# Patient Record
Sex: Female | Born: 1985
Health system: Southern US, Community
[De-identification: ages and names within clinical notes are randomized; demographics above are authoritative.]

## PROBLEM LIST (undated history)

## (undated) DIAGNOSIS — R51 Headache: Secondary | ICD-10-CM

## (undated) DIAGNOSIS — Z8489 Family history of other specified conditions: Secondary | ICD-10-CM

## (undated) DIAGNOSIS — N189 Chronic kidney disease, unspecified: Secondary | ICD-10-CM

## (undated) DIAGNOSIS — Z8619 Personal history of other infectious and parasitic diseases: Secondary | ICD-10-CM

## (undated) DIAGNOSIS — T7840XA Allergy, unspecified, initial encounter: Secondary | ICD-10-CM

## (undated) DIAGNOSIS — R519 Headache, unspecified: Secondary | ICD-10-CM

## (undated) HISTORY — PX: EYE SURGERY: SHX253

## (undated) HISTORY — DX: Allergy, unspecified, initial encounter: T78.40XA

## (undated) HISTORY — DX: Personal history of other infectious and parasitic diseases: Z86.19

---
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
## 2004-08-11 ENCOUNTER — Other Ambulatory Visit: Admission: RE | Admit: 2004-08-11 | Discharge: 2004-08-11 | Payer: Self-pay | Admitting: Obstetrics & Gynecology

## 2005-09-09 ENCOUNTER — Other Ambulatory Visit: Admission: RE | Admit: 2005-09-09 | Discharge: 2005-09-09 | Payer: Self-pay | Admitting: Obstetrics and Gynecology

## 2011-08-09 ENCOUNTER — Ambulatory Visit (INDEPENDENT_AMBULATORY_CARE_PROVIDER_SITE_OTHER): Payer: BC Managed Care – PPO

## 2011-08-09 DIAGNOSIS — J40 Bronchitis, not specified as acute or chronic: Secondary | ICD-10-CM

## 2011-10-20 ENCOUNTER — Ambulatory Visit (INDEPENDENT_AMBULATORY_CARE_PROVIDER_SITE_OTHER): Payer: BC Managed Care – PPO | Admitting: Physician Assistant

## 2011-10-20 VITALS — BP 118/76 | HR 72 | Temp 98.6°F | Resp 16 | Ht 66.25 in | Wt 206.0 lb

## 2011-10-20 DIAGNOSIS — R05 Cough: Secondary | ICD-10-CM

## 2011-10-20 DIAGNOSIS — J029 Acute pharyngitis, unspecified: Secondary | ICD-10-CM

## 2011-10-20 DIAGNOSIS — J069 Acute upper respiratory infection, unspecified: Secondary | ICD-10-CM

## 2011-10-20 MED ORDER — AZITHROMYCIN 250 MG PO TABS: ORAL_TABLET | ORAL | Status: AC

## 2011-10-20 MED ORDER — BENZONATATE 100 MG PO CAPS: 100.0000 mg | ORAL_CAPSULE | Freq: Three times a day (TID) | ORAL | Status: AC | PRN

## 2011-10-20 MED ORDER — IPRATROPIUM BROMIDE 0.06 % NA SOLN: 2.0000 | Freq: Three times a day (TID) | NASAL | Status: DC

## 2011-10-20 NOTE — Progress Notes (Signed)
Patient ID: Taylor Hicks MRN: [id number], DOB: 31-Aug-1985, 26 y.o. Date of Encounter: 10/20/2011, 9:14 AM  Primary Physician: No primary provider on file.  Chief Complaint:  Chief Complaint  Patient presents with  . Sore Throat    last night pnd sinus pain pressure  . Cough    mostly dry    HPI: 26 y.o. year old female presents with 5 day history of worsening nasal congestion, post nasal drip, and cough. Afebrile through out. No chills. Nasal congestion clear. Post nasal drip thick and yellow/green. Cough nonproductive and secondary to post nasal drip. Cough worse in the morning when she first wakes. Slightly muffled hearing. No sinus pressure. No headache. No GI complaints. Has tried Zyrtec x 1. Husband with similar symptoms. Treated with a Z pack earlier in the week, feeling much better.  Going on a cruise in 4 days.  No leg trauma, sedentary periods, h/o cancer, or tobacco use.  No past medical history on file.   Home Meds: Prior to Admission medications   Medication Sig Start Date End Date Taking? Authorizing Provider  etonogestrel-ethinyl estradiol (NUVARING) 0.12-0.015 MG/24HR vaginal ring Place 1 each vaginally every 28 (twenty-eight) days. Insert vaginally and leave in place for 3 consecutive weeks, then remove for 1 week.   Yes Historical Provider, MD    Allergies: No Known Allergies  History   Social History  . Marital Status: Married    Spouse Name: N/A    Number of Children: N/A  . Years of Education: N/A   Occupational History  . Not on file.   Social History Main Topics  . Smoking status: Never Smoker   . Smokeless tobacco: Not on file  . Alcohol Use: Not on file  . Drug Use: Not on file  . Sexually Active: Not on file   Other Topics Concern  . Not on file   Social History Narrative  . No narrative on file     Review of Systems: Constitutional: negative for chills, fever, night sweats or weight changes Cardiovascular: negative for chest pain  or palpitations Respiratory: negative for hemoptysis, wheezing, or shortness of breath Abdominal: negative for abdominal pain, nausea, vomiting or diarrhea Dermatological: negative for rash Neurologic: negative for headache   Physical Exam: Blood pressure 118/76, pulse 72, temperature 98.6 F (37 C), temperature source Oral, resp. rate 16, height 5' 6.25" (1.683 m), weight 206 lb (93.441 kg)., Body mass index is 33.00 kg/(m^2). General: Well developed, well nourished, in no acute distress. Head: Normocephalic, atraumatic, eyes without discharge, sclera non-icteric, nares are congested. Bilateral auditory canals clear, TM's are without perforation, pearly grey with reflective cone of light bilaterally. Serous effusion bilaterally behind TM's.No sinus TTP. Oral cavity moist, dentition normal. Posterior pharynx with post nasal drip and mild erythema. No peritonsillar abscess or tonsillar exudate. Neck: Supple. No thyromegaly. Full ROM. No lymphadenopathy. Lungs: Clear bilaterally to auscultation without wheezes, rales, or rhonchi. Breathing is unlabored. Heart: RRR with S1 S2. No murmurs, rubs, or gallops appreciated. Msk:  Strength and tone normal for age. Extremities: No clubbing or cyanosis. No edema. Neuro: Alert and oriented X 3. Moves all extremities spontaneously. CNII-XII grossly in tact. Psych:  Responds to questions appropriately with a normal affect.   Labs: Results for orders placed in visit on 10/20/11  POCT RAPID STREP A (OFFICE)      Component Value Range   Rapid Strep A Screen Negative  Negative    TC pending  ASSESSMENT AND PLAN:  26 y.o.  year old female with URI/cough -Azithromycin 250 MG #6 2 po first day then 1 po next 4 days no RF -Tessalon Perles 100 mg 1 po tid prn cough #30 no RF  -Atrovent NS 0.06% 2 sprays each nare bid prn #1 no RF -Mucinex -Tylenol/Motrin prn -Rest/fluids -RTC precautions -RTC 3-5 days if no improvement  Signed, Eula Listen,  PA-C 10/20/2011 9:14 AM

## 2011-10-23 LAB — CULTURE, GROUP A STREP

## 2011-11-14 ENCOUNTER — Ambulatory Visit (INDEPENDENT_AMBULATORY_CARE_PROVIDER_SITE_OTHER): Payer: BC Managed Care – PPO | Admitting: Family Medicine

## 2011-11-14 VITALS — BP 122/80 | HR 70 | Temp 98.8°F | Resp 18 | Ht 66.25 in | Wt 198.0 lb

## 2011-11-14 DIAGNOSIS — J029 Acute pharyngitis, unspecified: Secondary | ICD-10-CM

## 2011-11-14 NOTE — Progress Notes (Signed)
  Patient Name: Taylor Hicks Date of Birth: 1985-09-07 Medical Record Number: [id number] Gender: female Date of Encounter: 11/14/2011  History of Present Illness:  Taylor Hicks is a 26 y.o. very pleasant female patient who presents with the following:  Awoke today with a ST- husband seen today with allergies.  She notes that her throat felt worse today and that she felt fatigued.  She has a history of frequent strep.  No fever, no aches.  Notes a little PND, no cough or GI symtpoms.    There is no problem list on file for this patient.  No past medical history on file. No past surgical history on file. History  Substance Use Topics  . Smoking status: Never Smoker   . Smokeless tobacco: Not on file  . Alcohol Use: Not on file   No family history on file. No Known Allergies  Medication list has been reviewed and updated.  Review of Systems: As per HPI- otherwise negative.   Physical Examination: Filed Vitals:   11/14/11 1657  BP: 122/80  Pulse: 70  Temp: 98.8 F (37.1 C)  TempSrc: Oral  Resp: 18  Height: 5' 6.25" (1.683 m)  Weight: 198 lb (89.812 kg)    Body mass index is 31.72 kg/(m^2).  GEN: WDWN, NAD, Non-toxic, A & O x 3 HEENT: Atraumatic, Normocephalic. Neck supple. No masses, No LAD.  TM, oropharynx wnl,  PEERL Ears and Nose: No external deformity. CV: RRR, No M/G/R. No JVD. No thrill. No extra heart sounds. PULM: CTA B, no wheezes, crackles, rhonchi. No retractions. No resp. distress. No accessory muscle use. EXTR: No c/c/e NEURO Normal gait.  PSYCH: Normally interactive. Conversant. Not depressed or anxious appearing.  Calm demeanor.   Results for orders placed in visit on 11/14/11  POCT RAPID STREP A (OFFICE)      Component Value Range   Rapid Strep A Screen Negative  Negative    Assessment and Plan: 1. Sore throat  POCT rapid strep A, Throat culture ([facility])   Suspect viral vs allergic ST.  Follow- up pending culture, she has atrovent nasal to use  prn.  Sooner if worse.

## 2011-11-17 LAB — CULTURE, GROUP A STREP: Organism ID, Bacteria: NORMAL

## 2012-06-07 ENCOUNTER — Ambulatory Visit (INDEPENDENT_AMBULATORY_CARE_PROVIDER_SITE_OTHER): Payer: BC Managed Care – PPO | Admitting: Family Medicine

## 2012-06-07 ENCOUNTER — Encounter: Payer: Self-pay | Admitting: Family Medicine

## 2012-06-07 VITALS — BP 118/81 | HR 89 | Temp 98.2°F | Resp 16 | Ht 66.0 in | Wt 202.8 lb

## 2012-06-07 DIAGNOSIS — Z9283 Personal history of failed moderate sedation: Secondary | ICD-10-CM

## 2012-06-07 DIAGNOSIS — H6690 Otitis media, unspecified, unspecified ear: Secondary | ICD-10-CM

## 2012-06-07 DIAGNOSIS — Z283 Underimmunization status: Secondary | ICD-10-CM

## 2012-06-07 DIAGNOSIS — Z23 Encounter for immunization: Secondary | ICD-10-CM

## 2012-06-07 DIAGNOSIS — H9209 Otalgia, unspecified ear: Secondary | ICD-10-CM

## 2012-06-07 DIAGNOSIS — Z2839 Other underimmunization status: Secondary | ICD-10-CM

## 2012-06-07 DIAGNOSIS — H6691 Otitis media, unspecified, right ear: Secondary | ICD-10-CM

## 2012-06-07 DIAGNOSIS — J329 Chronic sinusitis, unspecified: Secondary | ICD-10-CM

## 2012-06-07 MED ORDER — AZITHROMYCIN 250 MG PO TABS: ORAL_TABLET | ORAL | Status: DC

## 2012-06-07 NOTE — Progress Notes (Signed)
[facility]:  Office Visit  Chief Complaint:  Chief Complaint  Patient presents with  . Ear Fullness    ears feel congested some sinus pain and pressure    HPI: Taylor Hicks is a 26 y.o. female who complains of  Ear congestion and URI sxs x several weeks. Tried sudafed without relief. Worse at night. Right worse than left. Subjective fever. Tuesday night had sinus HA between eyes. Took ibuprofen without relief.   History reviewed. No pertinent past medical history. History reviewed. No pertinent past surgical history. History   Social History  . Marital Status: Married    Spouse Name: N/A    Number of Children: N/A  . Years of Education: N/A   Social History Main Topics  . Smoking status: Never Smoker   . Smokeless tobacco: None  . Alcohol Use: Yes  . Drug Use: No  . Sexually Active: None   Other Topics Concern  . None   Social History Narrative  . None   Family History  Problem Relation Age of Onset  . Diabetes Father    No Known Allergies Prior to Admission medications   Medication Sig Start Date End Date Taking? Authorizing Provider  etonogestrel-ethinyl estradiol (NUVARING) 0.12-0.015 MG/24HR vaginal ring Place 1 each vaginally every 28 (twenty-eight) days. Insert vaginally and leave in place for 3 consecutive weeks, then remove for 1 week.   Yes Historical Provider, MD  pseudoephedrine (SUDAFED) 30 MG tablet Take 30 mg by mouth every 4 (four) hours as needed.   Yes Historical Provider, MD  pseudoephedrine-acetaminophen (TYLENOL SINUS) 30-500 MG TABS Take 1 tablet by mouth every 4 (four) hours as needed.   Yes Historical Provider, MD  ipratropium (ATROVENT) 0.06 % nasal spray Place 2 sprays into the nose 3 (three) times daily. 10/20/11 10/19/12  Raymon Mutton Dunn, PA-C     ROS: The patient denies fevers, chills, night sweats, unintentional weight loss, chest pain, palpitations, wheezing, dyspnea on exertion, nausea, vomiting, abdominal pain, dysuria,  hematuria, melena, numbness, weakness, or tingling.  All other systems have been reviewed and were otherwise negative with the exception of those mentioned in the HPI and as above.    PHYSICAL EXAM: Filed Vitals:   06/07/12 0800  BP: 118/81  Pulse: 89  Temp: 98.2 F (36.8 C)  Resp: 16   Filed Vitals:   06/07/12 0800  Height: 5\' 6"  (1.676 m)  Weight: 202 lb 12.8 oz (91.989 kg)   Body mass index is 32.73 kg/(m^2).  General: Alert, no acute distress HEENT:  Normocephalic, atraumatic, oropharynx patent. Right Tm erythematous, No fluid + sinus tenderness.No exudates Cardiovascular:  Regular rate and rhythm, no rubs murmurs or gallops.  No Carotid bruits, radial pulse intact. No pedal edema.  Respiratory: Clear to auscultation bilaterally.  No wheezes, rales, or rhonchi.  No cyanosis, no use of accessory musculature GI: No organomegaly, abdomen is soft and non-tender, positive bowel sounds.  No masses. Skin: No rashes. Neurologic: Facial musculature symmetric. Psychiatric: Patient is appropriate throughout our interaction. Lymphatic: No cervical lymphadenopathy Musculoskeletal: Gait intact.   LABS:   EKG/XRAY:   Primary read interpreted by Dr. Conley Rolls at [facility].   ASSESSMENT/PLAN: Encounter Diagnoses  Name Primary?  . Otalgia Yes  . Sinusitis   . Right otitis media   . Immunization deficiency    Flu vaccine given Rx Azithromycin for sinusitis. Her R otitis media is in early stages.Will give short course abx instead amoxacillin which will be double the time  frame.      LE, THAO PHUONG, DO 06/07/2012 8:30 AM

## 2012-06-13 ENCOUNTER — Ambulatory Visit (INDEPENDENT_AMBULATORY_CARE_PROVIDER_SITE_OTHER): Payer: BC Managed Care – PPO | Admitting: Family Medicine

## 2012-06-13 VITALS — BP 110/68 | HR 62 | Temp 98.1°F | Resp 16 | Ht 66.0 in | Wt 203.6 lb

## 2012-06-13 DIAGNOSIS — R21 Rash and other nonspecific skin eruption: Secondary | ICD-10-CM

## 2012-06-13 DIAGNOSIS — H6990 Unspecified Eustachian tube disorder, unspecified ear: Secondary | ICD-10-CM

## 2012-06-13 DIAGNOSIS — H6980 Other specified disorders of Eustachian tube, unspecified ear: Secondary | ICD-10-CM

## 2012-06-13 MED ORDER — PREDNISONE 20 MG PO TABS: ORAL_TABLET | ORAL | Status: DC

## 2012-06-13 NOTE — Patient Instructions (Addendum)
You may want to take claritin during the day, and you may want to add zantac for a few days as well.  Use benadryl as needed for itching, but remember it can make you drowsy

## 2012-06-13 NOTE — Progress Notes (Signed)
[facility] [address], [place] [address] [phone]  Date:  06/13/2012   Name:  Taylor Hicks   DOB:  Dec 26, 1985   MRN:  [id number]  PCP:  Taylor Finner, MD    Chief Complaint: Follow-up   History of Present Illness:  Taylor Hicks is a 26 y.o. very pleasant female patient who presents with the following:  She was here in 06/07/12 with complaint of ear congestion and URI symptoms.  She was found to have right OM and was treated with azithromycin.   She did feel that her ear got better, but it was hurting again this am. It has been popping and feeling full.  She has a little bit of pressure in her left ear, but her right ear is the main problem.  She is also concerned about a rash.  She had noted some itching on her skin on Sunday- she was here and received the zpack on Thursday.  She developed a rash on her abdomen, and hives that got worse on Monday.  The rash is now much better, and she has minimal itching.  No oral swelling, no SOB, no wheezing.   She has not taken any benadryl today so far- she last took it at 0300 this morning.    No chance of pregnancy- she is due for her next period in the next few days and is on contraception  There is no problem list on file for this patient.   No past medical history on file.  No past surgical history on file.  History  Substance Use Topics  . Smoking status: Never Smoker   . Smokeless tobacco: Not on file  . Alcohol Use: Yes    Family History  Problem Relation Age of Onset  . Diabetes Father     No Known Allergies  Medication list has been reviewed and updated.  Current Outpatient Prescriptions on File Prior to Visit  Medication Sig Dispense Refill  . etonogestrel-ethinyl estradiol (NUVARING) 0.12-0.015 MG/24HR vaginal ring Place 1 each vaginally every 28 (twenty-eight) days. Insert vaginally and leave in place for 3 consecutive weeks, then remove for 1 week.      . pseudoephedrine (SUDAFED)  30 MG tablet Take 30 mg by mouth every 4 (four) hours as needed.      Marland Kitchen azithromycin (ZITHROMAX) 250 MG tablet Take 2 pills po now then 1 pill po daily x 4 more days  6 tablet  0  . ipratropium (ATROVENT) 0.06 % nasal spray Place 2 sprays into the nose 3 (three) times daily.  15 mL  0  . pseudoephedrine-acetaminophen (TYLENOL SINUS) 30-500 MG TABS Take 1 tablet by mouth every 4 (four) hours as needed.        Review of Systems:  As per HPI- otherwise negative.   Physical Examination: Filed Vitals:   06/13/12 1044  BP: 110/68  Pulse: 62  Temp: 98.1 F (36.7 C)  Resp: 16   Filed Vitals:   06/13/12 1044  Height: 5\' 6"  (1.676 m)  Weight: 203 lb 9.6 oz (92.352 kg)   Body mass index is 32.86 kg/(m^2). Ideal Body Weight: Weight in (lb) to have BMI = 25: 154.6   GEN: WDWN, NAD, Non-toxic, A & O x 3, overweight HEENT: Atraumatic, Normocephalic. Neck supple. No masses, No LAD.  Left TM wnl, right TM appears normal but there is some fluid behind it- no redness, oropharynx normal.  PEERL,EOMI.   Ears and Nose: No external  deformity. CV: RRR, No M/G/R. No JVD. No thrill. No extra heart sounds. PULM: CTA B, no wheezes, crackles, rhonchi. No retractions. No resp. distress. No accessory muscle use. ABD: S, NT, ND Skin: there are some excoriations over her abdomen, but minimal rash remains.  No true urticaria at this time.  No other rash, no facial swelling or edema EXTR: No c/c/e NEURO Normal gait.  PSYCH: Normally interactive. Conversant. Not depressed or anxious appearing.  Calm demeanor.    Assessment and Plan: 1. Rash  predniSONE (DELTASONE) 20 MG tablet  2. ETD (eustachian tube dysfunction)     Suspect that Aerith may have had an allergic reaction to the azithromycin.  Will have her avoid this medication in the future.  No sign of current OM- likely that her current symptoms are due to ETD.  Will use a short course of prednisone for both.  See pt instructions for more details.  Let me  know if not better in the next couple of days- Sooner if worse.     Abbe Amsterdam, MD

## 2012-08-21 ENCOUNTER — Ambulatory Visit (INDEPENDENT_AMBULATORY_CARE_PROVIDER_SITE_OTHER): Payer: BC Managed Care – PPO | Admitting: Family Medicine

## 2012-08-21 VITALS — BP 119/88 | HR 89 | Temp 98.7°F | Resp 18 | Wt 201.0 lb

## 2012-08-21 DIAGNOSIS — H6590 Unspecified nonsuppurative otitis media, unspecified ear: Secondary | ICD-10-CM

## 2012-08-21 DIAGNOSIS — J029 Acute pharyngitis, unspecified: Secondary | ICD-10-CM

## 2012-08-21 LAB — POCT RAPID STREP A (OFFICE): Rapid Strep A Screen: NEGATIVE

## 2012-08-21 MED ORDER — FLUTICASONE PROPIONATE 50 MCG/ACT NA SUSP: 2.0000 | Freq: Every day | NASAL | Status: DC

## 2012-08-21 NOTE — Progress Notes (Signed)
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 She developed a sore throat on Sunday. It is continued to bother her. She's not had a fever. She coughs in the night when her throat gets very dry, but is not coughing in the day time. Her husband been sick with a illness for over a week. It is now gone. They do not have children.  She was treated for an ear problem several weeks ago, and it mostly cleared up but she still feels like she may have a little fluid her pressure in the right ear.  Objective: Healthy appearing lady in no major distress. TMs both are normal in coloration there may be a tiny bit of fluid at the base of the right ear still her throat is erythematous without exudate. Strep screen is taken. Neck supple without very small nodes. Chest is clear to auscultation. Heart regular without murmurs.  Assessment: Pharyngitis Very mild right serous otitis  Plan Check strep screen  Results for orders placed in visit on 08/21/12  POCT RAPID STREP A (OFFICE)      Component Value Range   Rapid Strep A Screen Negative  Negative   Will treat with flonase for ear, symptomatic throat rx

## 2012-08-21 NOTE — Patient Instructions (Addendum)
Viral Pharyngitis  Viral pharyngitis is a viral infection that produces redness, pain, and swelling (inflammation) of the throat. It can spread from person to person (contagious).  CAUSES  Viral pharyngitis is caused by inhaling a large amount of certain germs called viruses. Many different viruses cause viral pharyngitis.  SYMPTOMS  Symptoms of viral pharyngitis include:   Sore throat.   Tiredness.   Stuffy nose.   Low-grade fever.   Congestion.   Cough.  TREATMENT  Treatment includes rest, drinking plenty of fluids, and the use of over-the-counter medication (approved by your caregiver).  HOME CARE INSTRUCTIONS    Drink enough fluids to keep your urine clear or pale yellow.   Eat soft, cold foods such as ice cream, frozen ice pops, or gelatin dessert.   Gargle with warm salt water (1 tsp salt per 1 qt of water).   If over age 7, throat lozenges may be used safely.   Only take over-the-counter or prescription medicines for pain, discomfort, or fever as directed by your caregiver. Do not take aspirin.  To help prevent spreading viral pharyngitis to others, avoid:   Mouth-to-mouth contact with others.   Sharing utensils for eating and drinking.   Coughing around others.  SEEK MEDICAL CARE IF:    You are better in a few days, then become worse.   You have a fever or pain not helped by pain medicines.   There are any other changes that concern you.  Document Released: 05/18/2005 Document Revised: 10/31/2011 Document Reviewed: 10/14/2010  Exit[employer] Patient Information 2013 [employer].

## 2013-06-27 ENCOUNTER — Other Ambulatory Visit: Payer: Self-pay

## 2014-05-03 ENCOUNTER — Ambulatory Visit (INDEPENDENT_AMBULATORY_CARE_PROVIDER_SITE_OTHER): Payer: BC Managed Care – PPO | Admitting: Family Medicine

## 2014-05-03 VITALS — BP 105/70 | HR 91 | Temp 98.4°F | Ht 66.5 in | Wt 231.0 lb

## 2014-05-03 DIAGNOSIS — J029 Acute pharyngitis, unspecified: Secondary | ICD-10-CM

## 2014-05-03 DIAGNOSIS — H9209 Otalgia, unspecified ear: Secondary | ICD-10-CM

## 2014-05-03 DIAGNOSIS — H9202 Otalgia, left ear: Secondary | ICD-10-CM

## 2014-05-03 LAB — POCT RAPID STREP A (OFFICE): Rapid Strep A Screen: NEGATIVE

## 2014-05-03 MED ORDER — AMOXICILLIN 875 MG PO TABS: 875.0000 mg | ORAL_TABLET | Freq: Two times a day (BID) | ORAL | Status: DC

## 2014-05-03 NOTE — Progress Notes (Signed)
[facility] [address] [place] [address] [phone]  Date:  05/03/2014   Name:  Taylor Hicks   DOB:  [birth year]   MRN:  [id number]  PCP:  Freddy Finner, MD    Chief Complaint: Otalgia and Nasal Congestion   History of Present Illness:  Taylor Hicks is a 28 y.o. very pleasant female patient who presents with the following:  She has noted pain in her left ear for close to 2 weeks. It seems to be getting worse over the last week.  She has used saline NS and a neti- pot, but stil getting worse.   The left side of her face has hurt over the last couple of days.   She sees Dr. Jennette Kettle for her OB care. She tried sudafed on their advice over the last few days.   She felt like the sudafed was helping some, but then last night her ear hurt more again.  She has noted a bit of a ST.  She did have a cough last night.   She has noted some chills, but not fever She is due on 9/24.  Pregnancy is going well,  She did lose her mucus plug and noted a trace of blood after her cercix was checked yesterday.  Her baby is moving well.  No cramps or pain, no LOF.  This is her first baby   There are no active problems to display for this patient.   Past Medical History  Diagnosis Date  . Allergy     Past Surgical History  Procedure Laterality Date  . Eye surgery      History  Substance Use Topics  . Smoking status: Never Smoker   . Smokeless tobacco: Not on file  . Alcohol Use: Yes    Family History  Problem Relation Age of Onset  . Diabetes Father   . Hyperlipidemia Mother   . Diabetes Paternal Grandmother   . Diabetes Paternal Grandfather     Allergies  Allergen Reactions  . Azithromycin     May have caused a rash    Medication list has been reviewed and updated.  Current Outpatient Prescriptions on File Prior to Visit  Medication Sig Dispense Refill  . etonogestrel-ethinyl estradiol (NUVARING) 0.12-0.015 MG/24HR vaginal ring Place 1 each vaginally  every 28 (twenty-eight) days. Insert vaginally and leave in place for 3 consecutive weeks, then remove for 1 week.      . fluticasone (FLONASE) 50 MCG/ACT nasal spray Place 2 sprays into the nose daily.  1 g  3   No current facility-administered medications on file prior to visit.    Review of Systems:  As per HPI- otherwise negative.   Physical Examination: Filed Vitals:   05/03/14 0816  BP: 130/78  Pulse: 91   Filed Vitals:   05/03/14 0816  Height: 5' 6.5" (1.689 m)  Weight: 231 lb (104.781 kg)   Body mass index is 36.73 kg/(m^2). Ideal Body Weight: Weight in (lb) to have BMI = 25: 156.9  GEN: WDWN, NAD, Non-toxic, A & O x 3, looks well, pregnant HEENT: Atraumatic, Normocephalic. Neck supple. No masses, No LAD.  Bilateral TM wnl, oropharynx normal.  PEERL,EOMI.   Ears and Nose: No external deformity. CV: RRR, No M/G/R. No JVD. No thrill. No extra heart sounds. PULM: CTA B, no wheezes, crackles, rhonchi. No retractions. No resp. distress. No accessory muscle use. ABD: S, NT, ND, +BS. No rebound. No HSM. EXTR: No c/c/e NEURO  Normal gait.  PSYCH: Normally interactive. Conversant. Not depressed or anxious appearing.  Calm demeanor.   Results for orders placed in visit on 05/03/14  POCT RAPID STREP A (OFFICE)      Result Value Ref Range   Rapid Strep A Screen Negative  Negative   FHT at 120 BPM, abdomen is gravid but non- rigid, non- tender Assessment and Plan: Left ear pain - Plan: POCT rapid strep A, amoxicillin (AMOXIL) 875 MG tablet, DISCONTINUED: amoxicillin (AMOXIL) 875 MG tablet  Acute pharyngitis, unspecified pharyngitis type - Plan: POCT rapid strep A, amoxicillin (AMOXIL) 875 MG tablet, DISCONTINUED: amoxicillin (AMOXIL) 875 MG tablet  Treat for persistent ear pain and ST- may represent sinusitis- with amoxicillin as above.  Advised her to call her OBG office and report the minimal bleeding that she noted this morning- she will do so.  She will let us know if not  better soon- Sooner if worse.   Meds ordered this encounter  Medications  . cetirizine (ZYRTEC) 10 MG tablet    Sig: Take 10 mg by mouth daily.  . Prenatal Vit-Fe Fumarate-FA (PRENATAL MULTIVITAMIN) TABS tablet    Sig: Take 1 tablet by mouth daily at 12 noon.  Marland Kitchen DISCONTD: amoxicillin (AMOXIL) 875 MG tablet    Sig: Take 1 tablet (875 mg total) by mouth 2 (two) times daily.    Dispense:  20 tablet    Refill:  0  . amoxicillin (AMOXIL) 875 MG tablet    Sig: Take 1 tablet (875 mg total) by mouth 2 (two) times daily.    Dispense:  20 tablet    Refill:  0     Signed Abbe Amsterdam, MD

## 2014-05-03 NOTE — Patient Instructions (Signed)
Use the amoxicillin as directed for your ear and throat pain.  You can continue to use OTC medications as allowed per your OB-GYN.  Please call your OB practice and check in with them regarding the blood tinged mucus that you had today.  This is likely nothing to worry about but I could recommend that you call and make sure.

## 2014-05-04 ENCOUNTER — Encounter (HOSPITAL_COMMUNITY): Payer: Self-pay | Admitting: *Deleted

## 2014-05-04 ENCOUNTER — Inpatient Hospital Stay (HOSPITAL_COMMUNITY)
Admission: AD | Admit: 2014-05-04 | Discharge: 2014-05-08 | DRG: 766 | Disposition: A | Discharge status: Home / Self Care | Payer: BC Managed Care – PPO | Source: Ambulatory Visit | Attending: Obstetrics and Gynecology | Admitting: Obstetrics and Gynecology

## 2014-05-04 DIAGNOSIS — O4290 Premature rupture of membranes, unspecified as to length of time between rupture and onset of labor, unspecified weeks of gestation: Secondary | ICD-10-CM | POA: Diagnosis present

## 2014-05-04 DIAGNOSIS — Z833 Family history of diabetes mellitus: Secondary | ICD-10-CM

## 2014-05-04 DIAGNOSIS — O324XX0 Maternal care for high head at term, not applicable or unspecified: Secondary | ICD-10-CM | POA: Diagnosis present

## 2014-05-04 DIAGNOSIS — O26859 Spotting complicating pregnancy, unspecified trimester: Secondary | ICD-10-CM | POA: Diagnosis present

## 2014-05-04 LAB — CBC
HEMATOCRIT: 37.4 % (ref 36.0–46.0)
HEMOGLOBIN: 12.6 g/dL (ref 12.0–15.0)
MCH: 28.9 pg (ref 26.0–34.0)
MCHC: 33.7 g/dL (ref 30.0–36.0)
MCV: 85.8 fL (ref 78.0–100.0)
Platelets: 144 10*3/uL — ABNORMAL LOW (ref 150–400)
RBC: 4.36 MIL/uL (ref 3.87–5.11)
RDW: 12.5 % (ref 11.5–15.5)
WBC: 9.6 10*3/uL (ref 4.0–10.5)

## 2014-05-04 LAB — OB RESULTS CONSOLE RPR: RPR: NONREACTIVE

## 2014-05-04 LAB — TYPE AND SCREEN
ABO/RH(D): O POS
Antibody Screen: NEGATIVE

## 2014-05-04 LAB — URINALYSIS, ROUTINE W REFLEX MICROSCOPIC
BILIRUBIN URINE: NEGATIVE
GLUCOSE, UA: NEGATIVE mg/dL
HGB URINE DIPSTICK: NEGATIVE
KETONES UR: NEGATIVE mg/dL
Leukocytes, UA: NEGATIVE
NITRITE: NEGATIVE
PH: 6.5 (ref 5.0–8.0)
Protein, ur: NEGATIVE mg/dL
Urobilinogen, UA: 0.2 mg/dL (ref 0.0–1.0)

## 2014-05-04 LAB — OB RESULTS CONSOLE HIV ANTIBODY (ROUTINE TESTING): HIV: NONREACTIVE

## 2014-05-04 LAB — OB RESULTS CONSOLE GC/CHLAMYDIA
CHLAMYDIA, DNA PROBE: NEGATIVE
Gonorrhea: NEGATIVE

## 2014-05-04 LAB — OB RESULTS CONSOLE GBS: GBS: NEGATIVE

## 2014-05-04 LAB — OB RESULTS CONSOLE ABO/RH: RH Type: POSITIVE

## 2014-05-04 LAB — RPR

## 2014-05-04 LAB — OB RESULTS CONSOLE RUBELLA ANTIBODY, IGM: RUBELLA: NON-IMMUNE/NOT IMMUNE

## 2014-05-04 LAB — OB RESULTS CONSOLE HEPATITIS B SURFACE ANTIGEN: Hepatitis B Surface Ag: NEGATIVE

## 2014-05-04 LAB — OB RESULTS CONSOLE ANTIBODY SCREEN: Antibody Screen: NEGATIVE

## 2014-05-04 LAB — AMNISURE RUPTURE OF MEMBRANE (ROM) NOT AT ARMC: Amnisure ROM: POSITIVE

## 2014-05-04 MED ORDER — CITRIC ACID-SODIUM CITRATE 334-500 MG/5ML PO SOLN
30.0000 mL | ORAL | Status: DC | PRN
  Administered 2014-05-05 (×2): 30 mL via ORAL
  Filled 2014-05-04 (×2): qty 15

## 2014-05-04 MED ORDER — OXYCODONE-ACETAMINOPHEN 5-325 MG PO TABS: 2.0000 | ORAL_TABLET | ORAL | Status: DC | PRN

## 2014-05-04 MED ORDER — LACTATED RINGERS IV SOLN
INTRAVENOUS | Status: DC
  Administered 2014-05-04 – 2014-05-05 (×7): via INTRAVENOUS

## 2014-05-04 MED ORDER — OXYTOCIN BOLUS FROM INFUSION: 500.0000 mL | INTRAVENOUS | Status: DC

## 2014-05-04 MED ORDER — FENTANYL 2.5 MCG/ML BUPIVACAINE 1/10 % EPIDURAL INFUSION (WH - ANES)
14.0000 mL/h | INTRAMUSCULAR | Status: DC | PRN
  Administered 2014-05-05 (×4): 14 mL/h via EPIDURAL
  Filled 2014-05-04 (×4): qty 125

## 2014-05-04 MED ORDER — TERBUTALINE SULFATE 1 MG/ML IJ SOLN: 0.2500 mg | Freq: Once | INTRAMUSCULAR | Status: AC | PRN

## 2014-05-04 MED ORDER — ONDANSETRON HCL 4 MG/2ML IJ SOLN: 4.0000 mg | Freq: Four times a day (QID) | INTRAMUSCULAR | Status: DC | PRN

## 2014-05-04 MED ORDER — PSEUDOEPHEDRINE HCL ER 120 MG PO TB12
120.0000 mg | ORAL_TABLET | Freq: Two times a day (BID) | ORAL | Status: DC | PRN
  Administered 2014-05-04 – 2014-05-05 (×2): 120 mg via ORAL
  Filled 2014-05-04 (×2): qty 1

## 2014-05-04 MED ORDER — LACTATED RINGERS IV SOLN
500.0000 mL | Freq: Once | INTRAVENOUS | Status: AC
  Administered 2014-05-05: 500 mL via INTRAVENOUS

## 2014-05-04 MED ORDER — LIDOCAINE HCL (PF) 1 % IJ SOLN
30.0000 mL | INTRAMUSCULAR | Status: AC | PRN
  Administered 2014-05-05 (×2): 5 mL via SUBCUTANEOUS

## 2014-05-04 MED ORDER — PHENYLEPHRINE 40 MCG/ML (10ML) SYRINGE FOR IV PUSH (FOR BLOOD PRESSURE SUPPORT): 80.0000 ug | PREFILLED_SYRINGE | INTRAVENOUS | Status: DC | PRN

## 2014-05-04 MED ORDER — AMOXICILLIN 875 MG PO TABS
875.0000 mg | ORAL_TABLET | Freq: Two times a day (BID) | ORAL | Status: DC
  Administered 2014-05-04: 875 mg via ORAL
  Filled 2014-05-04 (×5): qty 1

## 2014-05-04 MED ORDER — AMOXICILLIN 250 MG PO CAPS: 875.0000 mg | ORAL_CAPSULE | Freq: Two times a day (BID) | ORAL | Status: DC

## 2014-05-04 MED ORDER — EPHEDRINE 5 MG/ML INJ: 10.0000 mg | INTRAVENOUS | Status: DC | PRN

## 2014-05-04 MED ORDER — DIPHENHYDRAMINE HCL 50 MG/ML IJ SOLN
12.5000 mg | INTRAMUSCULAR | Status: DC | PRN
  Administered 2014-05-05 (×2): 12.5 mg via INTRAVENOUS
  Filled 2014-05-04: qty 1

## 2014-05-04 MED ORDER — AMOXICILLIN 875 MG PO TABS
875.0000 mg | ORAL_TABLET | Freq: Two times a day (BID) | ORAL | Status: DC
  Administered 2014-05-05: 875 mg via ORAL

## 2014-05-04 MED ORDER — LACTATED RINGERS IV SOLN: 500.0000 mL | INTRAVENOUS | Status: DC | PRN

## 2014-05-04 MED ORDER — OXYTOCIN 40 UNITS IN LACTATED RINGERS INFUSION - SIMPLE MED
1.0000 m[IU]/min | INTRAVENOUS | Status: DC
  Administered 2014-05-04 – 2014-05-05 (×2): 2 m[IU]/min via INTRAVENOUS
  Filled 2014-05-04 (×2): qty 1000

## 2014-05-04 MED ORDER — OXYTOCIN 40 UNITS IN LACTATED RINGERS INFUSION - SIMPLE MED: 62.5000 mL/h | INTRAVENOUS | Status: DC

## 2014-05-04 MED ORDER — PHENYLEPHRINE 40 MCG/ML (10ML) SYRINGE FOR IV PUSH (FOR BLOOD PRESSURE SUPPORT)
80.0000 ug | PREFILLED_SYRINGE | INTRAVENOUS | Status: DC | PRN
  Filled 2014-05-04: qty 10

## 2014-05-04 MED ORDER — ACETAMINOPHEN 325 MG PO TABS: 650.0000 mg | ORAL_TABLET | ORAL | Status: DC | PRN

## 2014-05-04 MED ORDER — FLEET ENEMA 7-19 GM/118ML RE ENEM: 1.0000 | ENEMA | Freq: Every day | RECTAL | Status: DC | PRN

## 2014-05-04 MED ORDER — OXYCODONE-ACETAMINOPHEN 5-325 MG PO TABS: 1.0000 | ORAL_TABLET | ORAL | Status: DC | PRN

## 2014-05-04 NOTE — MAU Note (Signed)
HMitchell, RN charge called [id number] status, will call with room number

## 2014-05-04 NOTE — H&P (Signed)
Taylor Hicks is a 28 y.o. female presenting for Spotting after IC this afternoon and then noted to be leaking clear fluid and in MAU Amnisure was Pos c/w SROM.  Pregnancy uncomplicated GBS-. History OB History   Grav Para Term Preterm Abortions TAB SAB Ect Mult Living   1         0     Past Medical History  Diagnosis Date  . Allergy    Past Surgical History  Procedure Laterality Date  . Eye surgery    . Eye surgery     Family History: family history includes Diabetes in her father, paternal grandfather, and paternal grandmother; Hyperlipidemia in her mother. Social History:  reports that she has never smoked. She does not have any smokeless tobacco history on file. She reports that she drinks alcohol. She reports that she does not use illicit drugs.   Prenatal Transfer Tool  Maternal Diabetes: No Genetic Screening: Normal Maternal Ultrasounds/Referrals: Normal Fetal Ultrasounds or other Referrals:  None Maternal Substance Abuse:  No Significant Maternal Medications:  None Significant Maternal Lab Results:  None Other Comments:  None  ROS  Dilation: 1 Effacement (%): 70 Station: -1 Exam by:: Herma Carson, rn Blood pressure 132/93, pulse 92, temperature 97.9 F (36.6 C), temperature source Oral, resp. rate 18, height 5' 6.5" (1.689 m), weight 104.781 kg (231 lb). Exam Physical Exam  Prenatal labs: ABO, Rh: O/Positive/-- (09/13 0000) Antibody: Negative (09/13 0000) Rubella: Nonimmune (09/13 0000) RPR: Nonreactive (09/13 0000)  HBsAg: Negative (09/13 0000)  HIV: Non-reactive (09/13 0000)  GBS: Negative (09/13 0000)   Assessment/Plan: IUP at term with PROM clear fluid. Pitocin started and anticipate SVD Currently on Amoxil po for ear infection   Travion Ke C 05/04/2014, 7:43 PM

## 2014-05-04 NOTE — Progress Notes (Signed)
Notified pt's amnisure +, gbs negative, efm tracing reactive, orders obtained for admission.

## 2014-05-04 NOTE — MAU Note (Signed)
Pt presents to MAU with complaints of vaginal spotting with some lower abdominal cramping since this morning after intercourse. Denies any LOF

## 2014-05-04 NOTE — MAU Provider Note (Signed)
  History     CSN: [id number]  Arrival date and time: 05/04/14 1326   First Provider Initiated Contact with Patient 05/04/14 1424      Chief Complaint  Patient presents with  . Abdominal Cramping  . Vaginal Bleeding   HPI Taylor Hicks 28 y.o. [redacted]w[redacted]d  Comes to MAU with cramping and blood when she wipes after sex.  Had spotting yesterday as well.  Having intermittent contractions - not in labor.   OB History   Grav Para Term Preterm Abortions TAB SAB Ect Mult Living   1               Past Medical History  Diagnosis Date  . Allergy     Past Surgical History  Procedure Laterality Date  . Eye surgery      Family History  Problem Relation Age of Onset  . Diabetes Father   . Hyperlipidemia Mother   . Diabetes Paternal Grandmother   . Diabetes Paternal Grandfather     History  Substance Use Topics  . Smoking status: Never Smoker   . Smokeless tobacco: Not on file  . Alcohol Use: Yes    Allergies:  Allergies  Allergen Reactions  . Benzoyl Peroxide Itching  . Azithromycin Hives    Prescriptions prior to admission  Medication Sig Dispense Refill  . amoxicillin (AMOXIL) 875 MG tablet Take 1 tablet (875 mg total) by mouth 2 (two) times daily.  20 tablet  0  . cetirizine (ZYRTEC) 10 MG tablet Take 10 mg by mouth daily.      Marland Kitchen docusate sodium (COLACE) 50 MG capsule Take 100 mg by mouth 2 (two) times daily as needed for mild constipation.      . Prenatal Vit-Fe Fumarate-FA (PRENATAL MULTIVITAMIN) TABS tablet Take 1 tablet by mouth daily.       . pseudoephedrine (SUDAFED) 120 MG 12 hr tablet Take 120 mg by mouth 2 (two) times daily as needed for congestion.        Review of Systems  Gastrointestinal: Negative for nausea and vomiting.       Periodic cramping  Genitourinary:       No vaginal discharge. Vaginal bleeding. No dysuria.   Physical Exam   Blood pressure 136/84, pulse 83, temperature 98.1 F (36.7 C), resp. rate 18, height 5' 6.5" (1.689 m), weight 231  lb (104.781 kg).  Physical Exam  Nursing note and vitals reviewed. Constitutional: She is oriented to person, place, and time. She appears well-developed and well-nourished.  HENT:  Head: Normocephalic.  Eyes: EOM are normal.  Neck: Neck supple.  GI: Soft. There is no tenderness.  FHT strip has 15x15 accels.  No contractions. Reactive strip.  Genitourinary:  Cervical exam - loose 1 cm 50%.  Baby's head very low and cervix is posterior.  Minimal pink blood on pad, minimal blood on glove from exam.  Watery, mucusy discharge noted - possible ROM vs. Semen.   Will get Amnisure.  Musculoskeletal: Normal range of motion.  Neurological: She is alert and oriented to person, place, and time.  Skin: Skin is warm and dry.  Psychiatric: She has a normal mood and affect.    MAU Course  Procedures  MDM 1430  Consult with Dr. Rana Snare - if Amnisure is negtive, will send home.  Assessment and Plan  Positive Amnisure  Plan Dr. Rana Snare notified Patient to be admitted.  BURLESON,TERRI 05/04/2014, 2:25 PM

## 2014-05-04 NOTE — MAU Note (Signed)
Ok to take pt to room 171

## 2014-05-05 ENCOUNTER — Encounter (HOSPITAL_COMMUNITY)
Admission: AD | Disposition: A | Discharge status: Home / Self Care | Payer: Self-pay | Source: Ambulatory Visit | Attending: Obstetrics and Gynecology

## 2014-05-05 ENCOUNTER — Encounter (HOSPITAL_COMMUNITY): Payer: BC Managed Care – PPO | Admitting: Anesthesiology

## 2014-05-05 ENCOUNTER — Inpatient Hospital Stay (HOSPITAL_COMMUNITY): Payer: BC Managed Care – PPO | Admitting: Anesthesiology

## 2014-05-05 LAB — ABO/RH: ABO/RH(D): O POS

## 2014-05-05 SURGERY — Surgical Case
Anesthesia: Epidural | Site: Abdomen

## 2014-05-05 MED ORDER — SODIUM CHLORIDE 0.9 % IJ SOLN: INTRAMUSCULAR | Status: DC | PRN

## 2014-05-05 MED ORDER — MEPERIDINE HCL 25 MG/ML IJ SOLN
INTRAMUSCULAR | Status: AC
  Filled 2014-05-05: qty 1

## 2014-05-05 MED ORDER — SODIUM BICARBONATE 8.4 % IV SOLN
INTRAVENOUS | Status: DC | PRN
  Administered 2014-05-05: 10 mL via EPIDURAL

## 2014-05-05 MED ORDER — MORPHINE SULFATE 0.5 MG/ML IJ SOLN
INTRAMUSCULAR | Status: AC
  Filled 2014-05-05: qty 10

## 2014-05-05 MED ORDER — MEPERIDINE HCL 25 MG/ML IJ SOLN
INTRAMUSCULAR | Status: DC | PRN
  Administered 2014-05-05 (×2): 12.5 mg via INTRAVENOUS

## 2014-05-05 MED ORDER — BUPIVACAINE LIPOSOME 1.3 % IJ SUSP
20.0000 mL | Freq: Once | INTRAMUSCULAR | Status: DC
  Filled 2014-05-05: qty 20

## 2014-05-05 MED ORDER — SODIUM CHLORIDE 0.9 % IJ SOLN
INTRAMUSCULAR | Status: AC
Start: 2014-05-05 — End: 2014-05-05
  Filled 2014-05-05: qty 20

## 2014-05-05 MED ORDER — PHENYLEPHRINE 8 MG IN D5W 100 ML (0.08MG/ML) PREMIX OPTIME
INJECTION | INTRAVENOUS | Status: DC | PRN
  Administered 2014-05-05: 40 ug/min via INTRAVENOUS

## 2014-05-05 MED ORDER — ONDANSETRON HCL 4 MG/2ML IJ SOLN
INTRAMUSCULAR | Status: AC
  Filled 2014-05-05: qty 2

## 2014-05-05 MED ORDER — CEFAZOLIN SODIUM-DEXTROSE 2-3 GM-% IV SOLR
INTRAVENOUS | Status: DC | PRN
  Administered 2014-05-05: 2 g via INTRAVENOUS

## 2014-05-05 MED ORDER — SCOPOLAMINE 1 MG/3DAYS TD PT72
MEDICATED_PATCH | TRANSDERMAL | Status: AC
  Filled 2014-05-05: qty 1

## 2014-05-05 MED ORDER — ONDANSETRON HCL 4 MG/2ML IJ SOLN
INTRAMUSCULAR | Status: DC | PRN
  Administered 2014-05-05: 4 mg via INTRAVENOUS

## 2014-05-05 MED ORDER — LIDOCAINE HCL (PF) 1 % IJ SOLN
INTRAMUSCULAR | Status: AC
  Filled 2014-05-05: qty 30

## 2014-05-05 MED ORDER — BUPIVACAINE LIPOSOME 1.3 % IJ SUSP: INTRAMUSCULAR | Status: DC | PRN

## 2014-05-05 MED ORDER — MORPHINE SULFATE (PF) 0.5 MG/ML IJ SOLN
INTRAMUSCULAR | Status: DC | PRN
  Administered 2014-05-05: 4 mg via EPIDURAL

## 2014-05-05 MED ORDER — PHENYLEPHRINE HCL 10 MG/ML IJ SOLN
INTRAMUSCULAR | Status: AC
  Filled 2014-05-05: qty 1

## 2014-05-05 MED ORDER — 0.9 % SODIUM CHLORIDE (POUR BTL) OPTIME
TOPICAL | Status: DC | PRN
  Administered 2014-05-05: 1000 mL

## 2014-05-05 MED ORDER — OXYTOCIN 10 UNIT/ML IJ SOLN
40.0000 [IU] | INTRAMUSCULAR | Status: DC | PRN
  Administered 2014-05-05: 40 [IU] via INTRAVENOUS

## 2014-05-05 MED ORDER — CEFAZOLIN SODIUM-DEXTROSE 2-3 GM-% IV SOLR
INTRAVENOUS | Status: AC
  Filled 2014-05-05: qty 50

## 2014-05-05 MED ORDER — OXYTOCIN 10 UNIT/ML IJ SOLN
INTRAMUSCULAR | Status: AC
  Filled 2014-05-05: qty 4

## 2014-05-05 SURGICAL SUPPLY — 40 items
ADH SKN CLS APL DERMABOND .7 (GAUZE/BANDAGES/DRESSINGS)
APL SKNCLS STERI-STRIP NONHPOA (GAUZE/BANDAGES/DRESSINGS) ×1
BENZOIN TINCTURE PRP APPL 2/3 (GAUZE/BANDAGES/DRESSINGS) ×2 IMPLANT
BLADE SURG 10 STRL SS (BLADE) ×6 IMPLANT
CLAMP CORD UMBIL (MISCELLANEOUS) ×2 IMPLANT
CLOSURE WOUND 1/2 X4 (GAUZE/BANDAGES/DRESSINGS) ×2
CLOTH BEACON ORANGE TIMEOUT ST (SAFETY) ×3 IMPLANT
CONTAINER PREFILL 10% NBF 15ML (MISCELLANEOUS) IMPLANT
DERMABOND ADVANCED (GAUZE/BANDAGES/DRESSINGS)
DERMABOND ADVANCED .7 DNX12 (GAUZE/BANDAGES/DRESSINGS) IMPLANT
DRAPE LG THREE QUARTER DISP (DRAPES) ×2 IMPLANT
DRSG OPSITE POSTOP 4X10 (GAUZE/BANDAGES/DRESSINGS) ×3 IMPLANT
DURAPREP 26ML APPLICATOR (WOUND CARE) ×3 IMPLANT
ELECT REM PT RETURN 9FT ADLT (ELECTROSURGICAL) ×3
ELECTRODE REM PT RTRN 9FT ADLT (ELECTROSURGICAL) ×1 IMPLANT
EXTRACTOR VACUUM M CUP 4 TUBE (SUCTIONS) IMPLANT
EXTRACTOR VACUUM M CUP 4' TUBE (SUCTIONS)
GLOVE BIO SURGEON STRL SZ7.5 (GLOVE) ×3 IMPLANT
GOWN STRL REUS W/TWL LRG LVL3 (GOWN DISPOSABLE) ×6 IMPLANT
KIT ABG SYR 3ML LUER SLIP (SYRINGE) ×3 IMPLANT
NDL HYPO 21X1.5 SAFETY (NEEDLE) ×1 IMPLANT
NDL HYPO 25X5/8 SAFETYGLIDE (NEEDLE) ×1 IMPLANT
NEEDLE HYPO 21X1.5 SAFETY (NEEDLE) ×3 IMPLANT
NEEDLE HYPO 25X5/8 SAFETYGLIDE (NEEDLE) ×3 IMPLANT
NS IRRIG 1000ML POUR BTL (IV SOLUTION) ×3 IMPLANT
PACK C SECTION WH (CUSTOM PROCEDURE TRAY) ×3 IMPLANT
PAD OB MATERNITY 4.3X12.25 (PERSONAL CARE ITEMS) ×3 IMPLANT
STAPLER VISISTAT 35W (STAPLE) IMPLANT
STRIP CLOSURE SKIN 1/2X4 (GAUZE/BANDAGES/DRESSINGS) ×2 IMPLANT
SUT MNCRL 0 VIOLET CTX 36 (SUTURE) ×4 IMPLANT
SUT MONOCRYL 0 CTX 36 (SUTURE) ×8
SUT PDS AB 0 CTX 60 (SUTURE) ×3 IMPLANT
SUT PLAIN 0 NONE (SUTURE) IMPLANT
SUT PLAIN 2 0 (SUTURE) ×3
SUT PLAIN ABS 2-0 CT1 27XMFL (SUTURE) IMPLANT
SUT VIC AB 4-0 KS 27 (SUTURE) ×3 IMPLANT
SYRINGE 20CC LL (MISCELLANEOUS) ×3 IMPLANT
TOWEL OR 17X24 6PK STRL BLUE (TOWEL DISPOSABLE) ×3 IMPLANT
TRAY FOLEY CATH 14FR (SET/KITS/TRAYS/PACK) ×1 IMPLANT
WATER STERILE IRR 1000ML POUR (IV SOLUTION) ×1 IMPLANT

## 2014-05-05 NOTE — Anesthesia Preprocedure Evaluation (Addendum)
Anesthesia Evaluation  Patient identified by MRN, date of birth, ID band Patient awake    Reviewed: Allergy & Precautions, H&P , NPO status , Patient's Chart, lab work & pertinent test results  History of Anesthesia Complications Negative for: history of anesthetic complications  Airway Mallampati: II TM Distance: >3 FB Neck ROM: Full    Dental  (+) Teeth Intact   Pulmonary neg pulmonary ROS,  breath sounds clear to auscultation        Cardiovascular negative cardio ROS  Rhythm:Regular     Neuro/Psych negative neurological ROS  negative psych ROS   GI/Hepatic negative GI ROS, Neg liver ROS,   Endo/Other  negative endocrine ROS  Renal/GU negative Renal ROS     Musculoskeletal   Abdominal   Peds  Hematology negative hematology ROS (+)   Anesthesia Other Findings   Reproductive/Obstetrics (+) Pregnancy                           Anesthesia Physical Anesthesia Plan  ASA: II  Anesthesia Plan: Epidural   Post-op Pain Management:    Induction:   Airway Management Planned: Natural Airway  Additional Equipment:   Intra-op Plan:   Post-operative Plan:   Informed Consent: I have reviewed the patients History and Physical, chart, labs and discussed the procedure including the risks, benefits and alternatives for the proposed anesthesia with the patient or authorized representative who has indicated his/her understanding and acceptance.   Dental advisory given  Plan Discussed with: CRNA and Surgeon  Anesthesia Plan Comments:        Anesthesia Quick Evaluation

## 2014-05-05 NOTE — Anesthesia Procedure Notes (Signed)
Epidural Patient location during procedure: OB Start time: 05/05/2014 1:24 AM End time: 05/05/2014 1:38 AM  Staffing Anesthesiologist: Damario Gillie, CHRIS Performed by: anesthesiologist   Preanesthetic Checklist Completed: patient identified, surgical consent, pre-op evaluation, timeout performed, IV checked, risks and benefits discussed and monitors and equipment checked  Epidural Patient position: sitting Prep: site prepped and draped and DuraPrep Patient monitoring: heart rate, cardiac monitor, continuous pulse ox and blood pressure Approach: midline Location: L4-L5 Injection technique: LOR saline  Needle:  Needle type: Tuohy  Needle gauge: 17 G Needle length: 9 cm Needle insertion depth: 7 cm Catheter type: closed end flexible Catheter size: 19 Gauge Catheter at skin depth: 13 cm Test dose: Other  Assessment Events: blood not aspirated, injection not painful, no injection resistance, negative IV test and no paresthesia  Additional Notes H+P and labs checked, risks and benefits discussed with the patient, consent obtained, procedure tolerated well and without complications.  Reason for block:procedure for pain

## 2014-05-05 NOTE — Progress Notes (Signed)
Cx 3/90/-3/vtx AROM of forebag-clear IUPC placed FHT reactive UCs q2-3 min Epidural in Pitocin on D/W patient above

## 2014-05-05 NOTE — Brief Op Note (Signed)
05/04/2014 - 05/05/2014  11:52 PM  PATIENT:  Taylor Hicks  28 y.o. female  PRE-OPERATIVE DIAGNOSIS:  Arrested Descent  POST-OPERATIVE DIAGNOSIS:  Arrested Descent  PROCEDURE:  Procedure(s): CESAREAN SECTION (N/A)  SURGEON:  Surgeon(s) and Role:    * Leslie Andrea, MD - Primary  PHYSICIAN ASSISTANT:   ASSISTANTS: none   ANESTHESIA:   epidural  EBL:  Total I/O In: 2600 [I.V.:2600] Out: 1350 [Urine:350; Blood:1000]  BLOOD ADMINISTERED:none  DRAINS: Urinary Catheter (Foley)   LOCAL MEDICATIONS USED:  NONE  SPECIMEN:  Source of Specimen:  placenta  DISPOSITION OF SPECIMEN:  PATHOLOGY  COUNTS:  YES  TOURNIQUET:  * No tourniquets in log *  DICTATION: .Other Dictation: Dictation Number [id number]  PLAN OF CARE: Admit to inpatient   PATIENT DISPOSITION:  PACU - hemodynamically stable.   Delay start of Pharmacological VTE agent (>24hrs) due to surgical blood loss or risk of bleeding: not applicable

## 2014-05-05 NOTE — Progress Notes (Signed)
FHT reactive UCs q2-3 min Cx C/C/+2 Begin second stage

## 2014-05-05 NOTE — Progress Notes (Signed)
No change after >3 hours of pushing FHT reactive afeb A: arrest of descent  P: rec: cesarean section     D/W patient and husband risks including infection, organ damage, bleeding/transfusion-HIV/Hep, DVT/PE, pneumonia     All questions answered

## 2014-05-06 ENCOUNTER — Encounter (HOSPITAL_COMMUNITY): Payer: Self-pay | Admitting: *Deleted

## 2014-05-06 LAB — CBC
HCT: 29.7 % — ABNORMAL LOW (ref 36.0–46.0)
HEMATOCRIT: 31.2 % — AB (ref 36.0–46.0)
Hemoglobin: 10 g/dL — ABNORMAL LOW (ref 12.0–15.0)
Hemoglobin: 10.5 g/dL — ABNORMAL LOW (ref 12.0–15.0)
MCH: 28.8 pg (ref 26.0–34.0)
MCH: 28.9 pg (ref 26.0–34.0)
MCHC: 33.7 g/dL (ref 30.0–36.0)
MCHC: 33.7 g/dL (ref 30.0–36.0)
MCV: 85.7 fL (ref 78.0–100.0)
MCV: 85.8 fL (ref 78.0–100.0)
PLATELETS: 120 10*3/uL — AB (ref 150–400)
PLATELETS: 127 10*3/uL — AB (ref 150–400)
RBC: 3.46 MIL/uL — AB (ref 3.87–5.11)
RBC: 3.64 MIL/uL — ABNORMAL LOW (ref 3.87–5.11)
RDW: 12.4 % (ref 11.5–15.5)
RDW: 12.4 % (ref 11.5–15.5)
WBC: 12.2 10*3/uL — AB (ref 4.0–10.5)
WBC: 13.3 10*3/uL — AB (ref 4.0–10.5)

## 2014-05-06 MED ORDER — CEFAZOLIN SODIUM-DEXTROSE 2-3 GM-% IV SOLR: 2.0000 g | INTRAVENOUS | Status: DC

## 2014-05-06 MED ORDER — MEASLES, MUMPS & RUBELLA VAC ~~LOC~~ INJ
0.5000 mL | INJECTION | Freq: Once | SUBCUTANEOUS | Status: AC
  Administered 2014-05-08: 0.5 mL via SUBCUTANEOUS
  Filled 2014-05-06: qty 0.5

## 2014-05-06 MED ORDER — NALBUPHINE HCL 10 MG/ML IJ SOLN
5.0000 mg | INTRAMUSCULAR | Status: DC | PRN
  Administered 2014-05-06: 10 mg via INTRAVENOUS
  Filled 2014-05-06: qty 1

## 2014-05-06 MED ORDER — ZOLPIDEM TARTRATE 5 MG PO TABS: 5.0000 mg | ORAL_TABLET | Freq: Every evening | ORAL | Status: DC | PRN

## 2014-05-06 MED ORDER — IBUPROFEN 600 MG PO TABS
600.0000 mg | ORAL_TABLET | Freq: Four times a day (QID) | ORAL | Status: DC
  Administered 2014-05-06 – 2014-05-08 (×7): 600 mg via ORAL
  Filled 2014-05-06 (×8): qty 1

## 2014-05-06 MED ORDER — LACTATED RINGERS IV SOLN
INTRAVENOUS | Status: DC
  Administered 2014-05-06: 06:00:00 via INTRAVENOUS

## 2014-05-06 MED ORDER — KETOROLAC TROMETHAMINE 30 MG/ML IJ SOLN: 30.0000 mg | Freq: Four times a day (QID) | INTRAMUSCULAR | Status: AC | PRN

## 2014-05-06 MED ORDER — METOCLOPRAMIDE HCL 5 MG/ML IJ SOLN: 10.0000 mg | Freq: Three times a day (TID) | INTRAMUSCULAR | Status: DC | PRN

## 2014-05-06 MED ORDER — OXYCODONE-ACETAMINOPHEN 5-325 MG PO TABS
2.0000 | ORAL_TABLET | ORAL | Status: DC | PRN
  Administered 2014-05-07 – 2014-05-08 (×5): 2 via ORAL
  Filled 2014-05-06 (×5): qty 2

## 2014-05-06 MED ORDER — PRENATAL MULTIVITAMIN CH
1.0000 | ORAL_TABLET | Freq: Every day | ORAL | Status: DC
  Administered 2014-05-06 – 2014-05-07 (×2): 1 via ORAL
  Filled 2014-05-06 (×3): qty 1

## 2014-05-06 MED ORDER — DIPHENHYDRAMINE HCL 25 MG PO CAPS
25.0000 mg | ORAL_CAPSULE | ORAL | Status: DC | PRN
  Administered 2014-05-06 – 2014-05-07 (×3): 25 mg via ORAL
  Filled 2014-05-06 (×4): qty 1

## 2014-05-06 MED ORDER — MENTHOL 3 MG MT LOZG
1.0000 | LOZENGE | OROMUCOSAL | Status: DC | PRN
  Filled 2014-05-06: qty 9

## 2014-05-06 MED ORDER — FENTANYL CITRATE 0.05 MG/ML IJ SOLN
INTRAMUSCULAR | Status: AC
  Filled 2014-05-06: qty 2

## 2014-05-06 MED ORDER — FENTANYL CITRATE 0.05 MG/ML IJ SOLN
25.0000 ug | INTRAMUSCULAR | Status: DC | PRN
  Administered 2014-05-06 (×2): 50 ug via INTRAVENOUS

## 2014-05-06 MED ORDER — ONDANSETRON HCL 4 MG/2ML IJ SOLN
4.0000 mg | INTRAMUSCULAR | Status: DC | PRN
  Administered 2014-05-06: 4 mg via INTRAVENOUS
  Filled 2014-05-06: qty 2

## 2014-05-06 MED ORDER — MEPERIDINE HCL 25 MG/ML IJ SOLN: 6.2500 mg | INTRAMUSCULAR | Status: DC | PRN

## 2014-05-06 MED ORDER — NALBUPHINE HCL 10 MG/ML IJ SOLN: 5.0000 mg | INTRAMUSCULAR | Status: DC | PRN

## 2014-05-06 MED ORDER — SODIUM CHLORIDE 0.9 % IJ SOLN: 3.0000 mL | INTRAMUSCULAR | Status: DC | PRN

## 2014-05-06 MED ORDER — DIPHENHYDRAMINE HCL 25 MG PO CAPS
25.0000 mg | ORAL_CAPSULE | Freq: Four times a day (QID) | ORAL | Status: DC | PRN
  Filled 2014-05-06: qty 1

## 2014-05-06 MED ORDER — DIPHENHYDRAMINE HCL 50 MG/ML IJ SOLN: 25.0000 mg | INTRAMUSCULAR | Status: DC | PRN

## 2014-05-06 MED ORDER — SCOPOLAMINE 1 MG/3DAYS TD PT72: 1.0000 | MEDICATED_PATCH | Freq: Once | TRANSDERMAL | Status: DC

## 2014-05-06 MED ORDER — OXYTOCIN 40 UNITS IN LACTATED RINGERS INFUSION - SIMPLE MED: 62.5000 mL/h | INTRAVENOUS | Status: AC

## 2014-05-06 MED ORDER — NALOXONE HCL 1 MG/ML IJ SOLN
1.0000 ug/kg/h | INTRAMUSCULAR | Status: DC | PRN
  Filled 2014-05-06: qty 2

## 2014-05-06 MED ORDER — DIPHENHYDRAMINE HCL 50 MG/ML IJ SOLN
12.5000 mg | INTRAMUSCULAR | Status: DC | PRN
  Filled 2014-05-06: qty 1

## 2014-05-06 MED ORDER — INFLUENZA VAC SPLIT QUAD 0.5 ML IM SUSY
0.5000 mL | PREFILLED_SYRINGE | INTRAMUSCULAR | Status: AC
  Administered 2014-05-06: 0.5 mL via INTRAMUSCULAR

## 2014-05-06 MED ORDER — SCOPOLAMINE 1 MG/3DAYS TD PT72
MEDICATED_PATCH | TRANSDERMAL | Status: AC
  Filled 2014-05-06: qty 1

## 2014-05-06 MED ORDER — KETOROLAC TROMETHAMINE 30 MG/ML IJ SOLN
INTRAMUSCULAR | Status: AC
  Filled 2014-05-06: qty 1

## 2014-05-06 MED ORDER — DIBUCAINE 1 % RE OINT
1.0000 "application " | TOPICAL_OINTMENT | RECTAL | Status: DC | PRN
  Filled 2014-05-06: qty 28

## 2014-05-06 MED ORDER — NALOXONE HCL 0.4 MG/ML IJ SOLN: 0.4000 mg | INTRAMUSCULAR | Status: DC | PRN

## 2014-05-06 MED ORDER — OXYCODONE-ACETAMINOPHEN 5-325 MG PO TABS
1.0000 | ORAL_TABLET | ORAL | Status: DC | PRN
  Administered 2014-05-06 – 2014-05-07 (×5): 1 via ORAL
  Filled 2014-05-06 (×5): qty 1

## 2014-05-06 MED ORDER — KETOROLAC TROMETHAMINE 30 MG/ML IJ SOLN
30.0000 mg | Freq: Four times a day (QID) | INTRAMUSCULAR | Status: AC | PRN
  Administered 2014-05-06 (×2): 30 mg via INTRAVENOUS
  Filled 2014-05-06: qty 1

## 2014-05-06 MED ORDER — TETANUS-DIPHTH-ACELL PERTUSSIS 5-2.5-18.5 LF-MCG/0.5 IM SUSP
0.5000 mL | Freq: Once | INTRAMUSCULAR | Status: DC
  Filled 2014-05-06: qty 0.5

## 2014-05-06 MED ORDER — SIMETHICONE 80 MG PO CHEW
80.0000 mg | CHEWABLE_TABLET | ORAL | Status: DC
  Administered 2014-05-06 – 2014-05-07 (×2): 80 mg via ORAL
  Filled 2014-05-06 (×4): qty 1

## 2014-05-06 MED ORDER — SIMETHICONE 80 MG PO CHEW
80.0000 mg | CHEWABLE_TABLET | ORAL | Status: DC | PRN
  Filled 2014-05-06: qty 1

## 2014-05-06 MED ORDER — SIMETHICONE 80 MG PO CHEW
80.0000 mg | CHEWABLE_TABLET | Freq: Three times a day (TID) | ORAL | Status: DC
  Administered 2014-05-06 – 2014-05-08 (×7): 80 mg via ORAL
  Filled 2014-05-06 (×10): qty 1

## 2014-05-06 MED ORDER — ONDANSETRON HCL 4 MG/2ML IJ SOLN: 4.0000 mg | Freq: Three times a day (TID) | INTRAMUSCULAR | Status: DC | PRN

## 2014-05-06 MED ORDER — SENNOSIDES-DOCUSATE SODIUM 8.6-50 MG PO TABS
2.0000 | ORAL_TABLET | ORAL | Status: DC
  Administered 2014-05-06 – 2014-05-07 (×2): 2 via ORAL
  Filled 2014-05-06 (×4): qty 2

## 2014-05-06 MED ORDER — LANOLIN HYDROUS EX OINT: 1.0000 "application " | TOPICAL_OINTMENT | CUTANEOUS | Status: DC | PRN

## 2014-05-06 MED ORDER — WITCH HAZEL-GLYCERIN EX PADS: 1.0000 "application " | MEDICATED_PAD | CUTANEOUS | Status: DC | PRN

## 2014-05-06 MED ORDER — ONDANSETRON HCL 4 MG PO TABS: 4.0000 mg | ORAL_TABLET | ORAL | Status: DC | PRN

## 2014-05-06 NOTE — Progress Notes (Signed)
Pts PTA med,amoxicillin, was not reordered post partum due to administration of IV ancef in OR per Dr. Vincente Poli. Pharmacy sent remaining prescription to [facility] to be given back to pt with instructions not to continue the prescription.

## 2014-05-06 NOTE — Transfer of Care (Signed)
Immediate Anesthesia Transfer of Care Note  Patient: Taylor Hicks  Procedure(s) Performed: Procedure(s): CESAREAN SECTION (N/A)  Patient Location: PACU  Anesthesia Type:Epidural  Level of Consciousness: awake, alert  and oriented  Airway & Oxygen Therapy: Patient Spontanous Breathing  Post-op Assessment: Report given to PACU RN and Post -op Vital signs reviewed and stable  Post vital signs: Reviewed and stable  Complications: No apparent anesthesia complications

## 2014-05-06 NOTE — Lactation Note (Signed)
This note was copied from the chart of Taylor Alexsus Papadopoulos. Lactation Consultation Note: Initial visit with mom. Baby now 53 hours old and per mom has not really fed at all. Few attempts noted. Baby has been sleepy and spit up some. Mom with flat nipples. Mom easily able to hand express Colostrum, baby licked it off. Skin to skin with mom. RN will assist with hand pump and shells. No questions at present. BF brochure given with resources for support after DC.To call for assist prn.  Patient Name: Taylor Hicks [id number] Date: 05/06/2014 Reason for consult: Initial assessment   Maternal Data Formula Feeding for Exclusion: No Has patient been taught Hand Expression?: Yes Does the patient have breastfeeding experience prior to this delivery?: No  Feeding Feeding Type: Breast Fed Length of feed: 1 min (few sucks, sleepy)  LATCH Score/Interventions Latch: Too sleepy or reluctant, no latch achieved, no sucking elicited. (licked Colostrum off the breast)  Audible Swallowing: None  Type of Nipple: Flat Intervention(s): Hand pump;Shells (by RN)  Comfort (Breast/Nipple): Soft / non-tender     Hold (Positioning): Assistance needed to correctly position infant at breast and maintain latch. Intervention(s): Breastfeeding basics reviewed;Position options;Skin to skin  LATCH Score: 4  Lactation Tools Discussed/Used     Consult Status      Pamelia Hoit 05/06/2014, 2:26 PM

## 2014-05-06 NOTE — Op Note (Signed)
Taylor Hicks, Taylor Hicks NO.:  [id number]  MEDICAL RECORD NO.:  [id number]  LOCATION:  9124                          FACILITY:  [facility]  PHYSICIAN:  Guy Sandifer. Henderson Cloud, M.D. DATE OF BIRTH:  [birth year]  DATE OF PROCEDURE:  05/05/2014 DATE OF DISCHARGE:                              OPERATIVE REPORT   PREOPERATIVE DIAGNOSIS:  Arrest of descent.  POSTOPERATIVE DIAGNOSIS:  Arrest of descent.  PROCEDURE:  Low-transverse cesarean section.  SURGEON:  Guy Sandifer. Henderson Cloud, M.D.  ANESTHESIA:  Epidural, Dr. Sol Passer.  ESTIMATED BLOOD LOSS:  350 mL.  FINDINGS:  Viable female infant.  Apgars, arterial cord pH and weight pending.  INDICATIONS AND CONSENT:  This patient is a 28 year old G1, P0 at 71- 4/7th weeks, who progresses to complete and pushing.  After pushing for approximately 3 hours, baby does not descend below +2 station with caput.  Diagnosis of arrest of descent was made.  Recommendation for cesarean section was made.  Potential risks and complications were discussed preoperatively including, but not limited to, infection, organ damage, bleeding requiring transfusion of blood products with HIV and hepatitis acquisition, DVT, PE, pneumonia.  All questions were answered and consent was signed on the chart.  DESCRIPTION OF PROCEDURE:  The patient was taken to the operating room where epidural anesthetic was augmented to the surgical level.  She was placed in dorsal supine position with a 15-degree left lateral wedge. She was prepped.  Foley catheter was placed and the bladder to drain. She was draped in a sterile fashion per [facility] protocol.  After testing for adequate epidural anesthesia, skin was entered through the Pfannenstiel incision and dissection was carried out in layers of the peritoneum.  Peritoneum was incised, extended superiorly and inferiorly. Vesicouterine peritoneum was taken down cephalad laterally.  The bladder flap was developed and the bladder  blade was placed.  Uterus was incised in a low-transverse manner.  The uterine cavity was entered bluntly. The uterine incision was extended cephalad laterally with fingers. Vertex was then delivered without difficulty and the remainder of the baby was good delivered, and good cry and tone was noted.  Cord was clamped and cut.  The baby was handed to the awaiting pediatrics team. Placenta was manually delivered and sent to Pathology.  Uterine cavity was clean.  There were extensions of about 3 cm on each end of the uterine incision downward toward the cervix.  This incision was closed with two running imbricating layers of 0 Monocryl, which achieved good hemostasis.  Anterior peritoneum was closed in a running fashion with 0 Monocryl, which was also used to reapproximate the pyramidalis muscle in the midline.  Anterior rectus fascia was closed in a running fashion with 0 PDS looped suture.  Subcutaneous tissues were reapproximated with a plain and the skin was closed with a Vicryl on a Keith needle in a subcuticular fashion.  Benzoin, Steri-Strips and pressure dressing were applied.  All counts were correct.  The patient was taken to the recovery room in stable condition.     Guy Sandifer Henderson Cloud, M.D.     JET/MEDQ  D:  05/05/2014  T:  05/06/2014  Job:  [id number]

## 2014-05-06 NOTE — Progress Notes (Signed)
Subjective: Postpartum Day 1: Cesarean Delivery Patient reports tolerating PO.    Objective: Vital signs in last 24 hours: Temp:  [97.6 F (36.4 C)-100 F (37.8 C)] 98.4 F (36.9 C) (09/15 0626) Pulse Rate:  [52-102] 85 (09/15 0630) Resp:  [16-24] 16 (09/15 0438) BP: (99-141)/(59-97) 123/89 mmHg (09/15 0630) SpO2:  [95 %-100 %] 97 % (09/15 0438)  Physical Exam:  General: alert and cooperative Lochia: appropriate Uterine Fundus: firm Incision: honeycomb dressing noted with small old drainage noted on bandage DVT Evaluation: No evidence of DVT seen on physical exam. Negative Homan's sign. No cords or calf tenderness. No significant calf/ankle edema.   Recent Labs  05/06/14 0029 05/06/14 0530  HGB 10.0* 10.5*  HCT 29.7* 31.2*    Assessment/Plan: Status post Cesarean section. Doing well postoperatively.  Continue current care  desires circ prior to discharge.  Taylor Hicks G 05/06/2014, 8:15 AM

## 2014-05-06 NOTE — Anesthesia Postprocedure Evaluation (Signed)
  Anesthesia Post-op Note  Patient: Taylor Hicks  Procedure(s) Performed: Procedure(s): CESAREAN SECTION (N/A)  Patient Location: PACU and Mother/Baby  Anesthesia Type:Epidural  Level of Consciousness: awake, alert  and oriented  Airway and Oxygen Therapy: Patient Spontanous Breathing  Post-op Pain: mild  Post-op Assessment: Post-op Vital signs reviewed  Post-op Vital Signs: Reviewed and stable  Last Vitals:  Filed Vitals:   05/06/14 0630  BP: 123/89  Pulse: 85  Temp:   Resp:     Complications: No apparent anesthesia complications

## 2014-05-07 ENCOUNTER — Encounter (HOSPITAL_COMMUNITY): Payer: Self-pay | Admitting: Obstetrics and Gynecology

## 2014-05-07 NOTE — Progress Notes (Signed)
Post Partum Day 1 Subjective: no complaints and up ad lib  Objective: Blood pressure 133/78, pulse 77, temperature 97.9 F (36.6 C), temperature source Oral, resp. rate 18, height 5' 6.5" (1.689 m), weight 104.781 kg (231 lb), SpO2 98.00%, unknown if currently breastfeeding.  Physical Exam:  General: alert and cooperative Lochia: appropriate Uterine Fundus: firm Incision: n/a DVT Evaluation: No evidence of DVT seen on physical exam.   Recent Labs  05/06/14 0029 05/06/14 0530  HGB 10.0* 10.5*  HCT 29.7* 31.2*    Assessment/Plan: Plan for discharge tomorrow Desires circ today   LOS: 3 days   Taylor Hicks 05/07/2014, 9:25 AM

## 2014-05-07 NOTE — Lactation Note (Signed)
This note was copied from the chart of Taylor Hicks. Lactation Consultation Note Follow up visit at 43 hours of age.  Baby is asleep in FOB's arms.  Last feeding was a few hours ago.  Encouraged mom to attempt feeding and when moved baby began to show feeding cues.  Encouraged mom to wake baby every 2 1/2 -3 hours to increase feedings to 8-12 times in 24 hours.  Baby had circumcision this morning and has been sleepy.  Mom continues to need assist with latching.  Mom has very large breast that she was attempting to lift and then latch. Encouraged mom to allow breast back to natural position and to place baby nose to nipple.  Baby latches better with this technique, but does not maintain latch and suck probably due to flat nipples.  Hand pump with little eversion noted applied #20 nipple shield.  Mom reports using it sometimes and knows how to apply to allow nipple to pull through shield some. Mom also has a #24 in the room that is probably to big.  Baby latches well with quick strong rhythmic sucking for several minutes.  Baby slows and needs stimulation to maintain feeding pattern.  Baby has good strong jaw excursions, few swallows noted.  Observed for 15 minutes.  Mom to post pump for 15 minutes after feedings.  Report to [facility] RN, mom to call for assist as needed.    Patient Name: Taylor Hicks ZOXWR'U Date: 05/07/2014 Reason for consult: Follow-up assessment;Difficult latch   Maternal Data    Feeding Feeding Type: Breast Fed Length of feed:  (15 minutes observed)  LATCH Score/Interventions Latch: Repeated attempts needed to sustain latch, nipple held in mouth throughout feeding, stimulation needed to elicit sucking reflex. Intervention(s): Waking techniques;Teach feeding cues  Audible Swallowing: A few with stimulation Intervention(s): Hand expression  Type of Nipple: Flat Intervention(s): Hand pump  Comfort (Breast/Nipple): Soft / non-tender     Hold (Positioning): Assistance  needed to correctly position infant at breast and maintain latch. Intervention(s): Skin to skin;Position options;Support Pillows;Breastfeeding basics reviewed  LATCH Score: 6  Lactation Tools Discussed/Used Tools: Nipple Shields Nipple shield size: 20 Initiated by:: rn   Consult Status Consult Status: Follow-up Follow-up type: In-patient    Jannifer Rodney 05/07/2014, 6:48 PM

## 2014-05-08 MED ORDER — BENZONATATE 100 MG PO CAPS
200.0000 mg | ORAL_CAPSULE | Freq: Three times a day (TID) | ORAL | Status: DC
  Filled 2014-05-08: qty 2

## 2014-05-08 MED ORDER — BENZONATATE 200 MG PO CAPS: 200.0000 mg | ORAL_CAPSULE | Freq: Three times a day (TID) | ORAL | Status: DC

## 2014-05-08 MED ORDER — OXYCODONE-ACETAMINOPHEN 5-325 MG PO TABS: 1.0000 | ORAL_TABLET | ORAL | Status: DC | PRN

## 2014-05-08 MED ORDER — IBUPROFEN 600 MG PO TABS: 600.0000 mg | ORAL_TABLET | Freq: Four times a day (QID) | ORAL | Status: DC

## 2014-05-08 NOTE — Lactation Note (Addendum)
This note was copied from the chart of Taylor Hicks. Lactation Consultation Note  Mother has areola edema.  Reviewed hand expression and drops of colostrum expressed. Baby cueing.  Suggested mother prepump with hand pump to evert nipple before applying #20NS. Baby latched with asssitance but only a few sucks and swallows observed during 10 min feeding.  Baby sleepy at the breast.  LS6. No colostrum viewed in NS after feeding.  Mother relatched after changing diaper and a few more sucks witnessed. Reviewed what colostrum in NS should look like.  Identified to mother that during this feeding he transferred none to minimal colostrum. Mother needs lots of assistance with breastfeeding.  Mother states she has only been getting drops from post pumping with DEBP. Suggest within the next hour to retry breastfeeding after mother post pumps. Plan is for mother to breastfeed 8-12 times a day for more than 10 minutes.  Watch for swallows and breastmilk in NS. Then mother should post pump 4-6 times day for 15-20 min.  Massage and hand express before and after pumping. Breastmilk that is pumped should be given back to baby with next feeding either with syringe in NS or with cup after breastfeeding. Encouraged mother to massage breast during feeding and to observe for swallowing. If mother does not see breastmilk in NS after feeding or only drops with pumping, then mother should continue to supplement with formula until her milk transitions. Provided volume guidelines and an extra nipple shield.  Demonstrated how to use foley cup. Encouraged parents to monitor voids/stools, and attend support group. Reviewed engorgement care. Outpatient appt made for 9/29 1pm.    Patient Name: Taylor Oluwadamilola Deliz ZOXWR'U Date: 05/08/2014 Reason for consult: Infant weight loss;Follow-up assessment   Maternal Data    Feeding Feeding Type: Breast Fed Length of feed: 10 min  LATCH Score/Interventions Latch: Repeated  attempts needed to sustain latch, nipple held in mouth throughout feeding, stimulation needed to elicit sucking reflex. Intervention(s): Skin to skin;Waking techniques  Audible Swallowing: A few with stimulation Intervention(s): Skin to skin;Hand expression  Type of Nipple: Flat (edema) Intervention(s): Double electric pump;Hand pump;Shells  Comfort (Breast/Nipple): Soft / non-tender     Hold (Positioning): Assistance needed to correctly position infant at breast and maintain latch.  LATCH Score: 6  Lactation Tools Discussed/Used     Consult Status Consult Status: Follow-up Date: 05/20/14 Follow-up type: Out-patient    Dahlia Byes [facility] 05/08/2014, 11:42 AM

## 2014-05-08 NOTE — Discharge Summary (Signed)
Obstetric Discharge Summary Reason for Admission: onset of labor and rupture of membranes Prenatal Procedures: ultrasound Intrapartum Procedures: cesarean: low cervical, transverse Postpartum Procedures: none Complications-Operative and Postpartum: none Hemoglobin  Date Value Ref Range Status  05/06/2014 10.5* 12.0 - 15.0 g/dL Final     HCT  Date Value Ref Range Status  05/06/2014 31.2* 36.0 - 46.0 % Final    Physical Exam:  General: alert and cooperative Lochia: appropriate Uterine Fundus: firm Incision: honeycomb dressing noted with small old drainage noted on bandage DVT Evaluation: No evidence of DVT seen on physical exam. Negative Homan's sign. No cords or calf tenderness. Calf/Ankle edema is present.  Discharge Diagnoses: Term Pregnancy-delivered  Discharge Information: Date: 05/08/2014 Activity: pelvic rest Diet: routine Medications: PNV, Ibuprofen, Percocet and Tessalon Condition: improved Instructions: refer to practice specific booklet Discharge to: home   Newborn Data: Live born female  Birth Weight: 7 lb 14.8 oz (3595 g) APGAR: 9, 9  Home with mother.  Yianni Skilling G 05/08/2014, 8:33 AM

## 2014-05-10 ENCOUNTER — Telehealth: Payer: Self-pay

## 2014-05-10 NOTE — Telephone Encounter (Signed)
The patient called about the medication that had been prescribed for her ear infection.  She was given Amoxicillin on her visit to the office on 05/03/14, which she took up until the time of delivery of her baby.  She delivered via c-section on 05/05/14, and since she was given several doses of antibiotics at that time, they advised her to d/c the amoxicillin.  She said that her ear had started hurting again, so she wanted advise on whether she should start taking the amoxicillin again.  CB#: [phone].  Please advise.  Thank you.

## 2014-05-11 NOTE — Telephone Encounter (Signed)
It's ok to restart the Amoxicillin. If the pain persists, RTC for re-evaluation.  Congratulations on the birth of her new baby!

## 2014-05-11 NOTE — Telephone Encounter (Signed)
Pt's husband notified.

## 2014-05-16 ENCOUNTER — Ambulatory Visit (HOSPITAL_COMMUNITY)
Admission: RE | Admit: 2014-05-16 | Discharge: 2014-05-16 | Disposition: A | Discharge status: Home / Self Care | Payer: BC Managed Care – PPO | Source: Ambulatory Visit | Attending: Obstetrics & Gynecology | Admitting: Obstetrics & Gynecology

## 2014-05-16 NOTE — Lactation Note (Signed)
Lactation Consult  Mother's reason for visit:  Tips and help with breastfeeding Visit Type:  Outpatient, feeding assessment Appointment Notes:  Mom reports she is here for help with latch. Baby has been exclusively BF since Sunday but she is having pain with initial latch, PS=8 that improves as the baby nurses to PS=0. Mom reports nipples are cracked, no bleeding reported. Mom has been using Lanolin for comfort.  Consult:  Initial Lactation Consultant:  Alfred Levins  ________________________________________________________________________ Baby's Name: Zoila Shutter  Date of Birth: 05/05/2015  Pediatrician: Dr. Jerrell Mylar, [facility]  Gender: female  Gestational Age: [redacted]w[redacted]d (At Birth)  Birth Weight: 7 lb 14.8 oz (3595 g)  Weight at Discharge: Weight: 7 lb 5.8 oz (3340 g) Date of Discharge: 05/08/2014  [facility] Weights   05/05/14 2318 05/07/14 0020 05/07/14 2350  Weight: 7 lb 14.8 oz (3595 g) 7 lb 10.6 oz (3475 g) 7 lb 5.8 oz (3340 g)  [facility] taken [facility]: 05/11/14, 7 lb. 8.0 oz Location:Pediatrician's office  Weight today: 8 lb. 4.3 oz/3753 gm, now 36 days old    ________________________________________________________________________  Mother's Name: [name] Type of delivery:  C/S Breastfeeding Experience:  Primip Maternal Medical Conditions:  Ear infection - on antibiotics Maternal Medications:  PNV's, Ibuprofen, Amoxicillin for ear infection, cough medicine - could not remember name  ________________________________________________________________________  Breastfeeding History (Post Discharge)  Frequency of breastfeeding:  Every 2-3 hours Duration of feeding:  15-25 minutes, 1 breast each feeding. Alternates breast every feeding.  Patient does not supplement or pump. Mom reports the last time she supplemented was Saturday night 05/10/14 to Sunday morning. Last pumped her breast on Tuesday, 05/13/14. Has Medela DEBP at home.  Infant  Intake and Output Assessment  Voids:  6-8 in 24 hrs.  Color:  Clear yellow Stools:  6-8 in 24 hrs.  Color:  Yellow  ________________________________________________________________________  Maternal Breast Assessment  Breast:  Soft Nipple:  Flat, short shaft on lower portion of nipples,  cracking noted bilateral nipples at the tip Pain level:  0 with LC assist at this visit. Mom reports her PS at home has been 8 with initial latch then resolves to 0 as the baby nurses Pain interventions:  Comfort gels, Mom has been using lanolin for comfort.   _______________________________________________________________________ Feeding Assessment/Evaluation  Initial feeding assessment:  Infant's oral assessment:  WNL  Positioning:  Cross cradle Left breast  LATCH documentation:  Latch:  2 = Grasps breast easily, tongue down, lips flanged, rhythmical sucking.  Audible swallowing:  2 = Spontaneous and intermittent  Type of nipple:  1-Flat, short nipple shaft, becomes more erect with nursing on the upper half of nipple  Comfort (Breast/Nipple):  1-cracked at tip of nipple, moderate discomfort  Hold (Positioning):  1 = Assistance needed to correctly position infant at breast and maintain latch  LATCH score:  7  Attached assessment:  Deep, LC assisted Mom to obtain more depth. Mom was letting baby latch shallow at the beginning of the feed.   Lips flanged:  Yes.    Lips untucked:  No.  Suck assessment:  Nutritive  Tools:  Comfort gels Instructed on use and cleaning of tool:  Yes.    Pre-feed weight:  3750 g  (8 lb. 4.3 oz.) Post-feed weight:  3818 g (8 lb. 6.7 oz.) Amount transferred:  68 ml Amount supplemented:  0 ml  Additional Feeding Assessment -   Infant's oral assessment:  WNL  Positioning:  [employer]  cradle Right breast  LATCH documentation:  Latch:  2 = Grasps breast easily, tongue down, lips flanged, rhythmical sucking.  Audible swallowing:  2 = Spontaneous and  intermittent  Type of nipple:  1 = Flat, everts with baby nursing, short shaft on lower portion of nipple  Comfort (Breast/Nipple):  1 = Filling, red/small blisters or bruises, mild/mod discomfort  Hold (Positioning):  1 = Assistance needed to correctly position infant at breast and maintain latch  LATCH score:  7  Attached assessment:  Deep  Lips flanged:  Yes.    Lips untucked:  No.  Suck assessment:  Nutritive  Tools:  Comfort gels Instructed on use and cleaning of tool:  Yes.    Pre-feed weight:  3818 g  (8 lb. 6.7 oz.) Post-feed weight:  3868 g (8 lb. 8.4 oz.) Amount transferred:  50 ml Amount supplemented:  0 ml   Total amount pumped post feed:  R 0 ml    L 0 ml  No pumping needed.   Total amount transferred:  118 ml Total supplement given:  0 ml  Mom reported PS=0 with initial latch with LC demonstrating how to obtain depth with initial latch. No bleeding observed with the BF today. Care for sore nipples reviewed. Comfort gels given with instructions. Advised Mom not to use Lanolin on nipples due to antibiotic use and possible risk of yeast on nipple resulting in thrush for baby. Advised Mom to use olive oil/coconut oil as needed. Start Probiotics No evidence of yeast noted today. Pain with breastfeeding associated with shallow latch from what LC observed today with Mom's initial attempt to latch. Advised Mom if nipple pain/trauma does not improve with techniques demonstrated today, she should call for follow up. Otherwise, encouraged support group, prn.

## 2014-05-20 ENCOUNTER — Ambulatory Visit (HOSPITAL_COMMUNITY): Payer: BC Managed Care – PPO

## 2014-06-18 ENCOUNTER — Other Ambulatory Visit: Payer: Self-pay | Admitting: Obstetrics & Gynecology

## 2014-06-19 LAB — CYTOLOGY - PAP

## 2014-06-23 ENCOUNTER — Encounter (HOSPITAL_COMMUNITY): Payer: Self-pay | Admitting: Obstetrics and Gynecology

## 2014-07-08 ENCOUNTER — Ambulatory Visit (INDEPENDENT_AMBULATORY_CARE_PROVIDER_SITE_OTHER): Payer: BC Managed Care – PPO | Admitting: Family

## 2014-07-08 ENCOUNTER — Encounter: Payer: Self-pay | Admitting: Family

## 2014-07-08 VITALS — BP 120/80 | HR 78 | Temp 98.0°F | Resp 16 | Ht 66.0 in | Wt 195.2 lb

## 2014-07-08 DIAGNOSIS — Z0000 Encounter for general adult medical examination without abnormal findings: Secondary | ICD-10-CM | POA: Insufficient documentation

## 2014-07-08 LAB — URINALYSIS, ROUTINE W REFLEX MICROSCOPIC
BILIRUBIN URINE: NEGATIVE
Hgb urine dipstick: NEGATIVE
Ketones, ur: NEGATIVE
LEUKOCYTES UA: NEGATIVE
Nitrite: NEGATIVE
Specific Gravity, Urine: 1.015 (ref 1.000–1.030)
Total Protein, Urine: NEGATIVE
UROBILINOGEN UA: 0.2 (ref 0.0–1.0)
Urine Glucose: NEGATIVE
pH: 7.5 (ref 5.0–8.0)

## 2014-07-08 LAB — LIPID PANEL
CHOL/HDL RATIO: 4
Cholesterol: 207 mg/dL — ABNORMAL HIGH (ref 0–200)
HDL: 53 mg/dL (ref >39.00)
LDL CALC: 129 mg/dL — AB (ref 0–99)
NonHDL: 154
TRIGLYCERIDES: 127 mg/dL (ref 0.0–149.0)
VLDL: 25.4 mg/dL (ref 0.0–40.0)

## 2014-07-08 LAB — CBC WITH DIFFERENTIAL/PLATELET
Basophils Absolute: 0 10*3/uL (ref 0.0–0.1)
Basophils Relative: 0.8 % (ref 0.0–3.0)
EOS PCT: 1.9 % (ref 0.0–5.0)
Eosinophils Absolute: 0.1 10*3/uL (ref 0.0–0.7)
HCT: 41.3 % (ref 36.0–46.0)
Hemoglobin: 13.4 g/dL (ref 12.0–15.0)
Lymphocytes Relative: 32.6 % (ref 12.0–46.0)
Lymphs Abs: 1.9 10*3/uL (ref 0.7–4.0)
MCHC: 32.5 g/dL (ref 30.0–36.0)
MCV: 83 fl (ref 78.0–100.0)
MONOS PCT: 5.9 % (ref 3.0–12.0)
Monocytes Absolute: 0.3 10*3/uL (ref 0.1–1.0)
NEUTROS PCT: 58.8 % (ref 43.0–77.0)
Neutro Abs: 3.4 10*3/uL (ref 1.4–7.7)
Platelets: 190 10*3/uL (ref 150.0–400.0)
RBC: 4.97 Mil/uL (ref 3.87–5.11)
RDW: 13.6 % (ref 11.5–15.5)
WBC: 5.8 10*3/uL (ref 4.0–10.5)

## 2014-07-08 LAB — HEPATIC FUNCTION PANEL
ALT: 51 U/L — AB (ref 0–35)
AST: 43 U/L — AB (ref 0–37)
Albumin: 4.5 g/dL (ref 3.5–5.2)
Alkaline Phosphatase: 113 U/L (ref 39–117)
BILIRUBIN DIRECT: 0 mg/dL (ref 0.0–0.3)
Total Bilirubin: 0.7 mg/dL (ref 0.2–1.2)
Total Protein: 8.3 g/dL (ref 6.0–8.3)

## 2014-07-08 LAB — BASIC METABOLIC PANEL
BUN: 17 mg/dL (ref 6–23)
CALCIUM: 9.5 mg/dL (ref 8.4–10.5)
CO2: 23 mEq/L (ref 19–32)
CREATININE: 0.8 mg/dL (ref 0.4–1.2)
Chloride: 105 mEq/L (ref 96–112)
GFR: 94.08 mL/min (ref >60.00)
GLUCOSE: 82 mg/dL (ref 70–99)
Potassium: 4.6 mEq/L (ref 3.5–5.1)
Sodium: 137 mEq/L (ref 135–145)

## 2014-07-08 LAB — TSH: TSH: 0.68 u[IU]/mL (ref 0.35–4.50)

## 2014-07-08 NOTE — Progress Notes (Signed)
Pre visit review using our clinic review tool, if applicable. No additional management support is needed unless otherwise documented below in the visit note. 

## 2014-07-08 NOTE — Patient Instructions (Addendum)
Please complete your lab work prior to leaving.  Follow up in 1 year, sooner if problems or concerns. Welcome to [facility]!

## 2014-07-08 NOTE — Assessment & Plan Note (Signed)
Continue healthy diet, exercise, immunizations up to date (pap management per gyn).  Obtain routine lab work. Pt will arrange follow up with her retinal specialist for ongoing monitoring of her macular fibrosis.

## 2014-07-08 NOTE — Progress Notes (Signed)
Subjective:    Patient ID: Taylor Hicks, female    DOB: 31-Oct-1985, 28 y.o.   MRN: [id number]  HPI  Ms. Taylor Hicks is a 28 yr old female who presents today to establish care.   GYN is Secretary/administratorMeghan Morris at [employer]. Delivered a baby boy by c section 9 weeks ago. 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   Patient presents today for complete physical.  Immunizations: flu shot up to date reports tetanus in July. Diet: reports healthy diet Exercise: plans to start exercise Pap Smear: abnormal- will have colposcopy on Thursday.  Macular fibrosis- has seen retina specialist in [place] Review of Systems  Constitutional: Negative for unexpected weight change.  HENT: Negative for hearing loss and postnasal drip.   Eyes: Negative for visual disturbance.  Respiratory: Negative for shortness of breath.        Mild cough  Cardiovascular: Negative for chest pain.  Gastrointestinal: Negative for nausea, vomiting and diarrhea.  Genitourinary: Negative for dysuria, frequency and menstrual problem.  Musculoskeletal: Negative for myalgias and arthralgias.  Skin: Negative for rash.  Neurological:       Occasional headaches  Hematological: Negative for adenopathy.  Psychiatric/Behavioral:       Denies depression/anxiety   Past Medical History  Diagnosis Date  . Allergy   . History of chicken pox     History   Social History  . Marital Status: Married    Spouse Name: N/A    Number of Children: N/A  . Years of Education: N/A   Occupational History  . Not on file.   Social History Main Topics  . Smoking status: Never Smoker   . Smokeless tobacco: Not on file  . Alcohol Use: 0.0 oz/week    0 Not specified per week     Comment: social   . Drug Use: No  . Sexual Activity: Yes    Birth Control/ Protection: Inserts   Other Topics Concern  . 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 Cesarean section N/A 05/05/2014    Procedure: CESAREAN SECTION;  Surgeon: Leslie AndreaJames E Tomblin II, MD;  Location: [facility];  Service: Obstetrics;  Laterality: N/A;  . Eye surgery  1996  . Eye surgery      Family History  Problem Relation Age of Onset  . Diabetes Father   . Hypertension Father   . Hyperlipidemia Mother   . Diabetes Paternal Grandmother   . Diabetes Paternal Grandfather   . COPD Maternal Grandmother   . Osteoporosis Maternal Grandmother   . Cancer Maternal Grandfather     lung    Allergies  Allergen Reactions  . Benzoyl Peroxide Itching  . Azithromycin Hives    Current Outpatient Prescriptions on File Prior to Visit  Medication Sig Dispense Refill  . Prenatal Vit-Fe Fumarate-FA (PRENATAL MULTIVITAMIN) TABS tablet Take 1 tablet by mouth daily.      No current facility-administered medications on file prior to visit.    BP 120/80 mmHg  Pulse 78  Temp(Src) 98 F (36.7 C) (Oral)  Resp 16  Ht 5\' 6"  (1.676 m)  Wt 195 lb 3.2 oz (88.542 kg)  BMI 31.52 kg/m2  SpO2 99%        Objective:   Physical Exam  Constitutional: She is oriented to person, place, and time. She appears well-developed and well-nourished. No distress.  HENT:  Head: Normocephalic and atraumatic.  Right Ear: Tympanic membrane and ear canal normal.  Left Ear: Tympanic membrane and ear canal normal.  Cardiovascular: Normal rate and regular rhythm.   No murmur heard. Pulmonary/Chest: Effort normal and breath sounds normal. No respiratory distress. She has no wheezes. She has no rales. She exhibits no tenderness.  Abdominal: Soft. She exhibits no distension. There is no tenderness. There is no rebound.  Musculoskeletal: She exhibits no edema.  Neurological: She is alert and oriented to person, place, and time.    Psychiatric: She has a normal mood and affect. Her behavior is normal. Judgment and thought content normal.          Assessment & Plan:

## 2014-07-10 ENCOUNTER — Other Ambulatory Visit: Payer: Self-pay | Admitting: Obstetrics & Gynecology

## 2014-08-22 DIAGNOSIS — N189 Chronic kidney disease, unspecified: Secondary | ICD-10-CM

## 2014-08-22 HISTORY — DX: Chronic kidney disease, unspecified: N18.9

## 2014-08-28 ENCOUNTER — Ambulatory Visit (INDEPENDENT_AMBULATORY_CARE_PROVIDER_SITE_OTHER): Payer: BC Managed Care – PPO | Admitting: Internal Medicine

## 2014-08-28 ENCOUNTER — Encounter: Payer: Self-pay | Admitting: Internal Medicine

## 2014-08-28 VITALS — BP 118/76 | HR 69 | Temp 97.6°F | Ht 66.0 in | Wt 195.5 lb

## 2014-08-28 DIAGNOSIS — J309 Allergic rhinitis, unspecified: Secondary | ICD-10-CM

## 2014-08-28 MED ORDER — AMOXICILLIN 500 MG PO CAPS: 500.0000 mg | ORAL_CAPSULE | Freq: Three times a day (TID) | ORAL | Status: DC

## 2014-08-28 NOTE — Progress Notes (Signed)
Pre visit review using our clinic review tool, if applicable. No additional management support is needed unless otherwise documented below in the visit note. 

## 2014-08-28 NOTE — Progress Notes (Signed)
   Subjective:    Patient ID: Taylor Hicks, female    DOB: 03-14-1986, 29 y.o.   MRN: [id number]  HPI  Her symptoms began 08/24/14 as sneezing and rhinitis with clear drainage  This did progress over the next 48 hours. Today she has head congestion in the frontal sinus area. She also has discomfort in the ear.  As of yesterday she began to have a cough which is mainly dry. She does cough up scant clear material  She's been taking Advil with some benefit  She has no other signs of upper respiratory tract infection.  She's concerned as she has a 6932-month-old and is breast-feeding.  Review of Systems Facial pain , nasal purulence, dental pain, otic  discharge denied. No fever , chills or sweats.    Objective:   Physical Exam  General appearance:good health ;well nourished; no acute distress or increased work of breathing is present.  No  lymphadenopathy about the head, neck, or axilla noted.   Eyes: No conjunctival inflammation or lid edema is present. There is no scleral icterus.  Ears:  External ear exam shows no significant lesions or deformities.  Otoscopic examination reveals clear canals, tympanic membranes are intact bilaterally without bulging, retraction, inflammation or discharge.  Nose:  External nasal examination shows no deformity or inflammation. Nasal mucosa are pink and moist without lesions or exudates. No septal dislocation or deviation.No obstruction to airflow.   Oral exam: Dental hygiene is good; lips and gums are healthy appearing.There is no oropharyngeal erythema or exudate noted.   Neck:  No deformities, thyromegaly, masses, or tenderness noted.   Supple with full range of motion without pain.   Heart:  Normal rate and regular rhythm. S1 and S2 normal without gallop, murmur, click, rub or other extra sounds.   Lungs:Chest clear to auscultation; no wheezes, rhonchi,rales ,or rubs present.No increased work of breathing.    Extremities:  No cyanosis, edema, or  clubbing  noted    Skin: Warm & dry w/o jaundice or tenting.       Assessment & Plan:  #1 allergic rhinitis See orders & AVS

## 2014-08-28 NOTE — Patient Instructions (Signed)
Plain Mucinex (NOT D) for thick secretions ;force NON dairy fluids .   Nasal cleansing in the shower as discussed with lather of mild shampoo.After 10 seconds wash off lather while  exhaling through nostrils. Make sure that all residual soap is removed to prevent irritation.  Flonase OR Nasacort AQ 1 spray in each nostril twice a day as needed. Use the "crossover" technique into opposite nostril spraying toward opposite ear @ 45 degree angle, not straight up into nostril.  Plain Allegra (NOT D )  160 daily , Loratidine 10 mg , OR Zyrtec 10 mg @ bedtime  as needed for itchy eyes & sneezing. Zicam Melts or Zinc lozenges as per package label for sore throat . Complementary options include  vitamin C 2000 mg daily; & Echinacea for 4-7 days.  Fill the  prescription for antibiotic it there is not dramatic improvement in the next 72 hours.

## 2015-01-26 ENCOUNTER — Encounter: Payer: Self-pay | Admitting: Family

## 2015-01-26 ENCOUNTER — Ambulatory Visit (INDEPENDENT_AMBULATORY_CARE_PROVIDER_SITE_OTHER): Payer: BC Managed Care – PPO | Admitting: Family

## 2015-01-26 VITALS — BP 100/70 | HR 99 | Temp 99.7°F | Resp 16 | Ht 66.0 in

## 2015-01-26 DIAGNOSIS — N10 Acute tubulo-interstitial nephritis: Secondary | ICD-10-CM

## 2015-01-26 DIAGNOSIS — R300 Dysuria: Secondary | ICD-10-CM

## 2015-01-26 DIAGNOSIS — R509 Fever, unspecified: Secondary | ICD-10-CM

## 2015-01-26 LAB — POCT URINALYSIS DIPSTICK
Bilirubin, UA: NEGATIVE
Blood, UA: NEGATIVE
GLUCOSE UA: NEGATIVE
LEUKOCYTES UA: NEGATIVE
NITRITE UA: NEGATIVE
PH UA: 7.5
Spec Grav, UA: 1.015
Urobilinogen, UA: 0.2

## 2015-01-26 MED ORDER — CIPROFLOXACIN HCL 500 MG PO TABS: 500.0000 mg | ORAL_TABLET | Freq: Two times a day (BID) | ORAL | Status: DC

## 2015-01-26 NOTE — Progress Notes (Signed)
Pre visit review using our clinic review tool, if applicable. No additional management support is needed unless otherwise documented below in the visit note. 

## 2015-01-26 NOTE — Assessment & Plan Note (Signed)
D/C macrodantin, start cipro, send urine for culture. Pt advised to hydrate aggressively, use tylenol prn fever/aches. Follow up in 2 days, call sooner if symptoms worsen or do not improve.

## 2015-01-26 NOTE — Addendum Note (Signed)
Addended by: Sandford Craze'SULLIVAN, Renesmay Nesbitt on: 01/26/2015 08:03 AM   Modules accepted: Kipp BroodSmartSet

## 2015-01-26 NOTE — Patient Instructions (Signed)
Stop Macrodantin, start cipro. Call if fever >101 after starting cipro, if worsening low back pain or if symptoms do not improve. Follow up in 2 days.   Pyelonephritis, Adult Pyelonephritis is a kidney infection. In general, there are 2 main types of pyelonephritis:  Infections that come on quickly without any warning (acute pyelonephritis).  Infections that persist for a long period of time (chronic pyelonephritis). CAUSES  Two main causes of pyelonephritis are:  Bacteria traveling from the bladder to the kidney. This is a problem especially in pregnant women. The urine in the bladder can become filled with bacteria from multiple causes, including:  Inflammation of the prostate gland (prostatitis).  Sexual intercourse in females.  Bladder infection (cystitis).  Bacteria traveling from the bloodstream to the tissue part of the kidney. Problems that may increase your risk of getting a kidney infection include:  Diabetes.  Kidney stones or bladder stones.  Cancer.  Catheters placed in the bladder.  Other abnormalities of the kidney or ureter. SYMPTOMS   Abdominal pain.  Pain in the side or flank area.  Fever.  Chills.  Upset stomach.  Blood in the urine (dark urine).  Frequent urination.  Strong or persistent urge to urinate.  Burning or stinging when urinating. DIAGNOSIS  Your caregiver may diagnose your kidney infection based on your symptoms. A urine sample may also be taken. TREATMENT  In general, treatment depends on how severe the infection is.   If the infection is mild and caught early, your caregiver may treat you with oral antibiotics and send you home.  If the infection is more severe, the bacteria may have gotten into the bloodstream. This will require intravenous (IV) antibiotics and a hospital stay. Symptoms may include:  High fever.  Severe flank pain.  Shaking chills.  Even after a hospital stay, your caregiver may require you to be on  oral antibiotics for a period of time.  Other treatments may be required depending upon the cause of the infection. HOME CARE INSTRUCTIONS   Take your antibiotics as directed. Finish them even if you start to feel better.  Make an appointment to have your urine checked to make sure the infection is gone.  Drink enough fluids to keep your urine clear or pale yellow.  Take medicines for the bladder if you have urgency and frequency of urination as directed by your caregiver. SEEK IMMEDIATE MEDICAL CARE IF:   You have a fever or persistent symptoms for more than 2-3 days.  You have a fever and your symptoms suddenly get worse.  You are unable to take your antibiotics or fluids.  You develop shaking chills.  You experience extreme weakness or fainting.  There is no improvement after 2 days of treatment. MAKE SURE YOU:  Understand these instructions.  Will watch your condition.  Will get help right away if you are not doing well or get worse. Document Released: 08/08/2005 Document Revised: 02/07/2012 Document Reviewed: 01/12/2011 [facility] Patient Information 2015 [place]. This information is not intended to replace advice given to you by your health care provider. Make sure you discuss any questions you have with your health care provider.

## 2015-01-26 NOTE — Progress Notes (Addendum)
Subjective:    Patient ID: Taylor Hicks, female    DOB: [birth year], 29 y.o.   MRN: [id number]  HPI  Taylor Hicks is a 29 yr old female who presents today with chief complaint of dysuria/urinary frequency x 3 days.  She called her GYN on Friday and macrobid was called in for her. Reports that yesterday she had a fever of 102.  Has been alternating tylenol/motrin as needed for fever.   Pt reports that dysuria and frequency improved but Saturday night she developed "achiness" and felt horrible.  Last night achieness worsened.  She denies hematuria. Reports bilateral low back pain.  Review of Systems See HPI  Past Medical History  Diagnosis Date  . Allergy   . History of chicken pox     History   Social History  . Marital Status: Married    Spouse Name: N/A  . Number of Children: N/A  . Years of Education: N/A   Occupational History  . Not on file.   Social History Main Topics  . Smoking status: Never Smoker   . Smokeless tobacco: Not on file  . Alcohol Use: 0.0 oz/week    0 Standard drinks or equivalent per week     Comment: social   . Drug Use: No  . Sexual Activity: Yes    Birth Control/ Protection: Inserts   Other Topics Concern  . 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 Cesarean section N/A 05/05/2014    Procedure: CESAREAN SECTION;  Surgeon: Leslie Andrea, MD;  Location: [facility];  Service: Obstetrics;  Laterality: N/A;  . Eye surgery  1996  . Eye surgery      Family History  Problem Relation Age of Onset  . Diabetes Father   . Hypertension Father   . Hyperlipidemia Mother   . Diabetes Paternal Grandmother   . Diabetes Paternal Grandfather   . COPD Maternal Grandmother   . Osteoporosis  Maternal Grandmother   . Cancer Maternal Grandfather     lung    Allergies  Allergen Reactions  . Benzoyl Peroxide Itching  . Azithromycin Hives    Current Outpatient Prescriptions on File Prior to Visit  Medication Sig Dispense Refill  . cetirizine (ZYRTEC) 10 MG tablet Take 10 mg by mouth daily.    . Prenatal Vit-Fe Fumarate-FA (PRENATAL MULTIVITAMIN) TABS tablet Take 1 tablet by mouth daily.      No current facility-administered medications on file prior to visit.    BP 100/70 mmHg  Pulse 99  Temp(Src) 99.7 F (37.6 C) (Oral)  Resp 16  Ht  (1.676 m)  Wt   SpO2 96%  LMP 01/16/2015       Objective:   Physical Exam  Constitutional: She appears well-developed and well-nourished.  Ill appearing female laying on exam table.   Cardiovascular: Normal rate, regular rhythm and normal heart sounds.   No murmur heard. Pulmonary/Chest: Effort normal and breath sounds normal. No respiratory distress. She has no wheezes.  Abdominal: Soft. She exhibits no distension. There is no tenderness.  Genitourinary:  Mild bilateral CVAT  Skin: Skin is warm and dry.  Psychiatric: She has a normal mood and affect.  Her behavior is normal. Judgment and thought content normal.          Assessment & Plan:  Pt advised not to breastfeed while taking cipro ( she reports that baby nurses only once a day) discussed pumping once a day to keep up mild supply and then resuming breastfeeding after cipro complete.

## 2015-01-27 ENCOUNTER — Telehealth: Payer: Self-pay | Admitting: Family

## 2015-01-27 NOTE — Telephone Encounter (Signed)
Please advise re: follow up question below?

## 2015-01-27 NOTE — Telephone Encounter (Addendum)
°  Relation to pt: self  Call back number: [phone] Pharmacy: [facility] [id number] - [place], [place] - [address] AT [facility] [address] [phone] (Phone) [phone] (Fax)         Reason for call:  Pt states she was in so much pain she forgot to tell NP she has a sore throat pt requesting a RX. Pt also mentioned NP wanted her to follow up Wednesday 01/27/15, pt wants to know can she follow up Friday  01/30/15. Please advise

## 2015-01-27 NOTE — Telephone Encounter (Signed)
See my chart message

## 2015-01-28 ENCOUNTER — Encounter: Payer: Self-pay | Admitting: Family

## 2015-01-28 ENCOUNTER — Ambulatory Visit (INDEPENDENT_AMBULATORY_CARE_PROVIDER_SITE_OTHER): Payer: BC Managed Care – PPO | Admitting: Family

## 2015-01-28 VITALS — BP 120/80 | HR 77 | Temp 97.9°F | Resp 16 | Ht 66.0 in | Wt 199.8 lb

## 2015-01-28 DIAGNOSIS — N10 Acute tubulo-interstitial nephritis: Secondary | ICD-10-CM | POA: Diagnosis not present

## 2015-01-28 LAB — URINE CULTURE

## 2015-01-28 NOTE — Patient Instructions (Signed)
You can try AZO as needed for urinary discomfort.  Complete cipro. Call if symptoms are not completed resolved by the time you complete cipro or if you develop recurrent fever.

## 2015-01-28 NOTE — Assessment & Plan Note (Signed)
Clinically improving.  Complete cipro, ok to add prn azo as needed for what I think is residual urethral discomfort.

## 2015-01-28 NOTE — Progress Notes (Signed)
Pre visit review using our clinic review tool, if applicable. No additional management support is needed unless otherwise documented below in the visit note. 

## 2015-01-28 NOTE — Progress Notes (Signed)
Subjective:    Patient ID: Taylor Hicks, female    DOB: [birth year], 29 y.o.   MRN: [id number]  HPI  Taylor Hicks is a 29 yr old female who was see in on 6/6 with pyelonephritis.  Urine culture grew >100K Diptheroids.  Reports that her back pain is much better.  Denies fever.  Notes mild dysuria this AM. Had sore throat yesterday which is significantly improved.   Review of Systems See HPI  Past Medical History  Diagnosis Date  . Allergy   . History of chicken pox     History   Social History  . Marital Status: Married    Spouse Name: N/A  . Number of Children: N/A  . Years of Education: N/A   Occupational History  . Not on file.   Social History Main Topics  . Smoking status: Never Smoker   . Smokeless tobacco: Not on file  . Alcohol Use: 0.0 oz/week    0 Standard drinks or equivalent per week     Comment: social   . Drug Use: No  . Sexual Activity: Yes    Birth Control/ Protection: Inserts   Other Topics Concern  . 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 Cesarean section N/A 05/05/2014    Procedure: CESAREAN SECTION;  Surgeon: Leslie AndreaJames E Tomblin II, MD;  Location: [facility];  Service: Obstetrics;  Laterality: N/A;  . Eye surgery  1996  . Eye surgery      Family History  Problem Relation Age of Onset  . Diabetes Father   . Hypertension Father   . Hyperlipidemia Mother   . Diabetes Paternal Grandmother   . Diabetes Paternal Grandfather   . COPD Maternal Grandmother   . Osteoporosis Maternal Grandmother   . Cancer Maternal Grandfather     lung    Allergies  Allergen Reactions  . Benzoyl Peroxide Itching  . Azithromycin Hives    Current Outpatient Prescriptions on File Prior to Visit  Medication Sig  Dispense Refill  . cetirizine (ZYRTEC) 10 MG tablet Take 10 mg by mouth daily.    . ciprofloxacin (CIPRO) 500 MG tablet Take 1 tablet (500 mg total) by mouth 2 (two) times daily. 10 tablet 0  . Prenatal Vit-Fe Fumarate-FA (PRENATAL MULTIVITAMIN) TABS tablet Take 1 tablet by mouth daily.      No current facility-administered medications on file prior to visit.    BP 120/80 mmHg  Pulse 77  Temp(Src) 97.9 F (36.6 C) (Oral)  Resp 16  Ht 5\' 6"  (1.676 m)  Wt 199 lb 12.8 oz (90.629 kg)  BMI 32.26 kg/m2  SpO2 99%  LMP 01/16/2015       Objective:   Physical Exam  Constitutional: She appears well-developed and well-nourished.  HENT:  Head: Normocephalic and atraumatic.  Right Ear: Ear canal normal.  Left Ear: Tympanic membrane and ear canal normal.  Mouth/Throat: No oropharyngeal exudate, posterior oropharyngeal edema or posterior oropharyngeal erythema.  Clear fluid noted behind R TM, nor erythema or bulging  Cardiovascular: Normal rate, regular rhythm and normal heart sounds.   No murmur heard. Pulmonary/Chest: Effort normal and breath sounds normal. No respiratory distress. She has no wheezes.  Psychiatric:  She has a normal mood and affect. Her behavior is normal. Judgment and thought content normal.          Assessment & Plan:

## 2015-05-12 ENCOUNTER — Encounter: Payer: Self-pay | Admitting: Family

## 2015-05-12 ENCOUNTER — Ambulatory Visit (INDEPENDENT_AMBULATORY_CARE_PROVIDER_SITE_OTHER): Payer: 59 | Admitting: Family

## 2015-05-12 VITALS — BP 100/74 | HR 81 | Temp 97.8°F | Resp 16 | Ht 66.0 in | Wt 198.4 lb

## 2015-05-12 DIAGNOSIS — H6692 Otitis media, unspecified, left ear: Secondary | ICD-10-CM

## 2015-05-12 MED ORDER — AMOXICILLIN 500 MG PO CAPS: 500.0000 mg | ORAL_CAPSULE | Freq: Three times a day (TID) | ORAL | Status: DC

## 2015-05-12 NOTE — Progress Notes (Signed)
Pre visit review using our clinic review tool, if applicable. No additional management support is needed unless otherwise documented below in the visit note. 

## 2015-05-12 NOTE — Patient Instructions (Signed)
Please start amoxicillin for left ear infection. Call if symptoms worsen or if not improved in 3 days.

## 2015-05-12 NOTE — Progress Notes (Signed)
Subjective:    Patient ID: Taylor Hicks, female    DOB: [birth year], 29 y.o.   MRN: [id number]  HPI  Taylor Hicks is a 29 yr old female who presents today with chief complaint of cough. Symptoms started 9/16.    She reports that her cough as improved.  Initially had some sore throat/nasal drainage.She reports that last night she developed ear pressure. Using neti pot and sudafed with minimal improvement in her symptoms.   Review of Systems See HPI  Past Medical History  Diagnosis Date  . Allergy   . History of chicken pox     Social History   Social History  . Marital Status: Married    Spouse Name: N/A  . Number of Children: N/A  . Years of Education: N/A   Occupational History  . Not on file.   Social History Main Topics  . Smoking status: Never Smoker   . Smokeless tobacco: Not on file  . Alcohol Use: 0.0 oz/week    0 Standard drinks or equivalent per week     Comment: social   . Drug Use: No  . Sexual Activity: Yes    Birth Control/ Protection: Inserts   Other Topics Concern  . 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 Cesarean section N/A 05/05/2014    Procedure: CESAREAN SECTION;  Surgeon: Leslie Andrea, MD;  Location: [facility];  Service: Obstetrics;  Laterality: N/A;  . Eye surgery  1996  . Eye surgery      Family History  Problem Relation Age of Onset  . Diabetes Father   . Hypertension Father   . Hyperlipidemia Mother   . Diabetes Paternal Grandmother   . Diabetes Paternal Grandfather   . COPD Maternal Grandmother   . Osteoporosis Maternal Grandmother   . Cancer Maternal Grandfather     lung    Allergies  Allergen Reactions  . Benzoyl Peroxide Itching  . Azithromycin Hives     Current Outpatient Prescriptions on File Prior to Visit  Medication Sig Dispense Refill  . cetirizine (ZYRTEC) 10 MG tablet Take 10 mg by mouth daily.     No current facility-administered medications on file prior to visit.    BP 100/74 mmHg  Pulse 81  Temp(Src) 97.8 F (36.6 C) (Oral)  Resp 16  Ht  (1.676 m)  Wt 198 lb 6.4 oz (89.994 kg)  BMI 32.04 kg/m2  SpO2 99%       Objective:   Physical Exam  Constitutional: She is oriented to person, place, and time. She appears well-developed and well-nourished.  HENT:  Right Ear: Tympanic membrane and ear canal normal.  Left Ear: Ear canal normal. Tympanic membrane is erythematous.  Mouth/Throat: No oropharyngeal exudate, posterior oropharyngeal edema or posterior oropharyngeal erythema.  Cardiovascular: Normal rate, regular rhythm and normal heart sounds.   No murmur heard. Pulmonary/Chest: Effort normal and breath sounds normal. No respiratory distress. She has no wheezes.  Musculoskeletal: She exhibits no edema.  Lymphadenopathy:    She has no cervical adenopathy.  Neurological: She is alert and oriented to person, place, and time.  Skin: Skin is warm and dry.  Psychiatric: She has a  normal mood and affect. Her behavior is normal. Judgment and thought content normal.          Assessment & Plan:  Left otitis media- rx with amoxicillin, pt advised to follow up if symptoms worsen or do not improve.

## 2015-05-14 ENCOUNTER — Encounter: Payer: Self-pay | Admitting: Family

## 2015-05-14 MED ORDER — FLUCONAZOLE 150 MG PO TABS: ORAL_TABLET | ORAL | Status: DC

## 2015-05-29 ENCOUNTER — Encounter: Payer: Self-pay | Admitting: Medical

## 2015-05-29 ENCOUNTER — Ambulatory Visit (INDEPENDENT_AMBULATORY_CARE_PROVIDER_SITE_OTHER): Payer: 59 | Admitting: Medical

## 2015-05-29 VITALS — BP 102/70 | HR 76 | Temp 98.0°F | Ht 66.0 in | Wt 196.8 lb

## 2015-05-29 DIAGNOSIS — R350 Frequency of micturition: Secondary | ICD-10-CM | POA: Diagnosis not present

## 2015-05-29 DIAGNOSIS — N390 Urinary tract infection, site not specified: Secondary | ICD-10-CM

## 2015-05-29 DIAGNOSIS — R82998 Other abnormal findings in urine: Secondary | ICD-10-CM

## 2015-05-29 LAB — POCT URINALYSIS DIPSTICK
BILIRUBIN UA: NEGATIVE
Blood, UA: NEGATIVE
Glucose, UA: NEGATIVE
KETONES UA: NEGATIVE
PH UA: 6
Protein, UA: NEGATIVE
SPEC GRAV UA: 1.015
Urobilinogen, UA: 0.2

## 2015-05-29 MED ORDER — NITROFURANTOIN MONOHYD MACRO 100 MG PO CAPS: 100.0000 mg | ORAL_CAPSULE | Freq: Two times a day (BID) | ORAL | Status: DC

## 2015-05-29 NOTE — Patient Instructions (Signed)
Your appear to have a urinary tract infection . I am prescribing macrobid  antibiotic for the probable infection. Hydrate well. I am sending out a urine culture. During the interim if your signs and symptoms worsen rather than improving please notify us. We will notify your when the culture results are back.  Follow up in 7 days or as needed. 

## 2015-05-29 NOTE — Progress Notes (Signed)
Pre visit review using our clinic review tool, if applicable. No additional management support is needed unless otherwise documented below in the visit note. 

## 2015-05-29 NOTE — Addendum Note (Signed)
Addended by: Neldon Labella on: 05/29/2015 11:52 AM   Modules accepted: Orders

## 2015-05-29 NOTE — Progress Notes (Signed)
Subjective:    Patient ID: Taylor Hicks, female    DOB: [birth year], 29 y.o.   MRN: [id number]  HPI   Pt in today reporting urinary symptoms since this morning.  Dysuria- yes. This am.(Took azo earlier today) Frequent urination- yes also more frquent.  Hesitancy-yes. Suprapubic pressure-yes over bladder. Fever-no chills-no Nausea-no Vomiting-no CVA pain-no History of UTI-Pt states about 2 uti this past year. States in may uti turned into kidney infection. Gross hematuria-no.  LMP- Just finished on Sunday.(normal menstrual cycle)   Review of Systems  Constitutional: Negative for fever, chills and fatigue.  Respiratory: Negative for cough, choking and wheezing.   Cardiovascular: Negative for chest pain and palpitations.  Gastrointestinal: Positive for abdominal pain.  Genitourinary: Positive for dysuria, urgency and frequency. Negative for flank pain, vaginal bleeding, difficulty urinating and vaginal pain.  Musculoskeletal: Negative for back pain.  Skin: Negative for rash.  Neurological: Negative for dizziness, syncope, speech difficulty, weakness, light-headedness and numbness.  Hematological: Negative for adenopathy.  Psychiatric/Behavioral: Negative for confusion and sleep disturbance.    Past Medical History  Diagnosis Date  . Allergy   . History of chicken pox     Social History   Social History  . Marital Status: Married    Spouse Name: N/A  . Number of Children: N/A  . Years of Education: N/A   Occupational History  . Not on file.   Social History Main Topics  . Smoking status: Never Smoker   . Smokeless tobacco: Not on file  . Alcohol Use: 0.0 oz/week    0 Standard drinks or equivalent per week     Comment: social   . Drug Use: No  . Sexual Activity: Yes    Birth Control/ Protection: Inserts   Other Topics Concern  . 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 Cesarean section N/A 05/05/2014    Procedure: CESAREAN SECTION;  Surgeon: Leslie Andrea, MD;  Location: [facility];  Service: Obstetrics;  Laterality: N/A;  . Eye surgery  1996  . Eye surgery      Family History  Problem Relation Age of Onset  . Diabetes Father   . Hypertension Father   . Hyperlipidemia Mother   . Diabetes Paternal Grandmother   . Diabetes Paternal Grandfather   . COPD Maternal Grandmother   . Osteoporosis Maternal Grandmother   . Cancer Maternal Grandfather     lung    Allergies  Allergen Reactions  . Benzoyl Peroxide Itching  . Azithromycin Hives    Current Outpatient Prescriptions on File Prior to Visit  Medication Sig Dispense Refill  . cetirizine (ZYRTEC) 10 MG tablet Take 10 mg by mouth daily.     No current facility-administered medications on file prior to visit.    BP 102/70 mmHg  Pulse 76  Temp(Src) 98 F (36.7 C) (Oral)  Ht  (1.676 m)  Wt 196 lb 12.8 oz (89.268 kg)  BMI 31.78 kg/m2  SpO2 98%       Objective:   Physical Exam  General Appearance- Not in acute distress.  HEENT Eyes- Scleraeral/Conjuntiva-bilat- Not Yellow. Mouth & Throat- Normal.  Chest and Lung Exam Auscultation: Breath sounds:-Normal.  Adventitious sounds:- No Adventitious sounds.  Cardiovascular Auscultation:Rythm - Regular. Heart Sounds -Normal heart sounds.  Abdomen Inspection:-Inspection Normal.  Palpation/Perucssion: Palpation and Percussion of the abdomen reveal- faint suprapubic Tender, No Rebound tenderness, No rigidity(Guarding) and No Palpable abdominal masses.  Liver:-Normal.  Spleen:- Normal.   Back- no cva tenderness      Assessment & Plan:  Your appear to have a urinary tract infection. I am prescribing  macrobid antibiotic for the probable infection.  Hydrate well. I am sending out a urine culture. During the interim if your signs and symptoms worsen rather than improving please notify us. We will notify your when the culture results are back.  Follow up in 7 days or as needed.

## 2015-05-30 LAB — URINE CULTURE
Colony Count: NO GROWTH
Organism ID, Bacteria: NO GROWTH

## 2015-06-05 ENCOUNTER — Other Ambulatory Visit (HOSPITAL_COMMUNITY)
Admission: RE | Admit: 2015-06-05 | Discharge: 2015-06-05 | Disposition: A | Discharge status: Home / Self Care | Payer: 59 | Source: Ambulatory Visit | Attending: Physician Assistant | Admitting: Physician Assistant

## 2015-06-05 ENCOUNTER — Encounter: Payer: Self-pay | Admitting: Physician Assistant

## 2015-06-05 ENCOUNTER — Ambulatory Visit (INDEPENDENT_AMBULATORY_CARE_PROVIDER_SITE_OTHER): Payer: 59 | Admitting: Physician Assistant

## 2015-06-05 VITALS — BP 120/82 | HR 71 | Temp 98.0°F | Resp 16 | Ht 66.0 in | Wt 198.2 lb

## 2015-06-05 DIAGNOSIS — N760 Acute vaginitis: Secondary | ICD-10-CM | POA: Diagnosis present

## 2015-06-05 DIAGNOSIS — Z23 Encounter for immunization: Secondary | ICD-10-CM | POA: Diagnosis not present

## 2015-06-05 DIAGNOSIS — N898 Other specified noninflammatory disorders of vagina: Secondary | ICD-10-CM

## 2015-06-05 LAB — POCT URINALYSIS DIPSTICK
BILIRUBIN UA: NEGATIVE
GLUCOSE UA: NEGATIVE
Ketones, UA: NEGATIVE
Leukocytes, UA: NEGATIVE
Nitrite, UA: NEGATIVE
Protein, UA: NEGATIVE
RBC UA: NEGATIVE
SPEC GRAV UA: 1.01
Urobilinogen, UA: 0.2
pH, UA: 6

## 2015-06-05 MED ORDER — METRONIDAZOLE 0.75 % EX GEL: CUTANEOUS | Status: DC

## 2015-06-05 NOTE — Progress Notes (Signed)
Pre visit review using our clinic review tool, if applicable. No additional management support is needed unless otherwise documented below in the visit note/SLS  

## 2015-06-05 NOTE — Assessment & Plan Note (Signed)
Giving appearance and odor, suspect BV. Wet prep sent for testing. Urine sent for culture. Will Rx metrogel to apply as directed. Start probiotic. Supportive measures reviewed with patient.

## 2015-06-05 NOTE — Patient Instructions (Signed)
Please stay well hydrated and start a probiotic. Use the Metrogel as directed to help with vaginal symptoms. I will call you with results and we will alter regimen based on results

## 2015-06-05 NOTE — Progress Notes (Signed)
History of Present Illness: Taylor Hicks is a 29 y.o. female who present to the clinic today complaining of continued urinary frequency, urinary urgency and intermittent dysuria.  Patient endorses pelvic discomfort which is relieved with urination.  Patient denies fever, chills, nausea/vomiting and flank pain. Patient endorses some mild vaginal discharge with odor. Was recently on antibiotics for ear infection with recent yeast infection. Also was seen last week for UTI and placed on Macrobid. Culture was negative.  History: Past Medical History  Diagnosis Date  . Allergy   . History of chicken pox     Current outpatient prescriptions:  .  cetirizine (ZYRTEC) 10 MG tablet, Take 10 mg by mouth daily., Disp: , Rfl:  .  metroNIDAZOLE (METROGEL) 0.75 % gel, Insert one applicatorful (5g) of medicine into the vagina once nightly x 5 days, Disp: 25 g, Rfl: 0 Allergies  Allergen Reactions  . Benzoyl Peroxide Itching  . Azithromycin Hives   Family History  Problem Relation Age of Onset  . Diabetes Father   . Hypertension Father   . Hyperlipidemia Mother   . Diabetes Paternal Grandmother   . Diabetes Paternal Grandfather   . COPD Maternal Grandmother   . Osteoporosis Maternal Grandmother   . Cancer Maternal Grandfather     lung   Social History   Social History  . Marital Status: Married    Spouse Name: N/A  . Number of Children: N/A  . Years of Education: N/A   Social History Main Topics  . Smoking status: Never Smoker   . Smokeless tobacco: None  . Alcohol Use: 0.0 oz/week    0 Standard drinks or equivalent per week     Comment: social   . Drug Use: No  . Sexual Activity: Yes    Birth Control/ Protection: Inserts   Other Topics Concern  . 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  All other ROS are negative.  Physical Examination: BP 120/82 mmHg  Pulse 71  Temp(Src) 98 F (36.7 C) (Oral)  Resp 16  Ht 5\' 6"  (1.676 m)  Wt 198 lb 4 oz (89.926 kg)  BMI 32.01 kg/m2  SpO2 99%  LMP 05/18/2015  General appearance: alert, cooperative, appears stated age and no distress Head: Normocephalic, without obvious abnormality, atraumatic Lungs: clear to auscultation bilaterally Heart: regular rate and rhythm, S1, S2 normal, no murmur, click, rub or gallop Abdomen: soft, non-tender; bowel sounds normal; no masses,  no organomegaly and no CVA tenderness. Pelvic: no external vaginal lesions or irritation noted. Thin, white discharge noted with strong fishy odor. No CMT noted. No adnexal or uterine mass palpable on bimanual examination.  Labs/Diagnostics: Urinalysis    Component Value Date/Time   COLORURINE YELLOW 07/08/2014 1210   APPEARANCEUR CLEAR 07/08/2014 1210   LABSPEC 1.015 07/08/2014 1210   PHURINE 7.5 07/08/2014 1210   GLUCOSEU NEGATIVE 07/08/2014 1210   GLUCOSEU NEGATIVE 05/04/2014 1340   HGBUR NEGATIVE 07/08/2014 1210   BILIRUBINUR neg 06/05/2015 1648   BILIRUBINUR NEGATIVE 07/08/2014 1210   KETONESUR NEGATIVE 07/08/2014 1210   PROTEINUR neg 06/05/2015 1648   PROTEINUR NEGATIVE 05/04/2014 1340   UROBILINOGEN 0.2 06/05/2015 1648   UROBILINOGEN 0.2 07/08/2014 1210   NITRITE neg 06/05/2015 1648  NITRITE NEGATIVE 07/08/2014 1210   LEUKOCYTESUR Negative 06/05/2015 1648    Assessment/Plan: Vaginal discharge Giving appearance and odor, suspect BV. Wet prep sent for testing. Urine sent for culture. Will Rx metrogel to apply as directed. Start probiotic. Supportive measures reviewed with patient.

## 2015-06-09 LAB — CERVICOVAGINAL ANCILLARY ONLY: Wet Prep (BD Affirm): NEGATIVE

## 2015-06-11 LAB — CULTURE, URINE COMPREHENSIVE

## 2015-06-12 ENCOUNTER — Other Ambulatory Visit: Payer: Self-pay | Admitting: Physician Assistant

## 2015-06-12 MED ORDER — CIPROFLOXACIN HCL 250 MG PO TABS: 250.0000 mg | ORAL_TABLET | Freq: Two times a day (BID) | ORAL | Status: DC

## 2015-07-10 ENCOUNTER — Telehealth: Payer: Self-pay | Admitting: Behavioral Health

## 2015-07-10 ENCOUNTER — Encounter: Payer: Self-pay | Admitting: Behavioral Health

## 2015-07-10 NOTE — Telephone Encounter (Signed)
Pre-Visit Call completed with patient and chart updated.   Pre-Visit Info documented in Specialty Comments under [employer].    

## 2015-07-13 ENCOUNTER — Ambulatory Visit (INDEPENDENT_AMBULATORY_CARE_PROVIDER_SITE_OTHER): Payer: 59 | Admitting: Family

## 2015-07-13 ENCOUNTER — Encounter: Payer: Self-pay | Admitting: Family

## 2015-07-13 VITALS — BP 127/71 | HR 65 | Temp 97.8°F | Resp 18 | Ht 66.0 in | Wt 200.0 lb

## 2015-07-13 DIAGNOSIS — N3000 Acute cystitis without hematuria: Secondary | ICD-10-CM | POA: Diagnosis not present

## 2015-07-13 DIAGNOSIS — Z0000 Encounter for general adult medical examination without abnormal findings: Secondary | ICD-10-CM

## 2015-07-13 LAB — URINALYSIS, ROUTINE W REFLEX MICROSCOPIC
BILIRUBIN URINE: NEGATIVE
HGB URINE DIPSTICK: NEGATIVE
Ketones, ur: NEGATIVE
NITRITE: POSITIVE — AB
PH: 7 (ref 5.0–8.0)
RBC / HPF: NONE SEEN (ref 0–?)
Specific Gravity, Urine: 1.015 (ref 1.000–1.030)
Total Protein, Urine: NEGATIVE
Urine Glucose: NEGATIVE
Urobilinogen, UA: 0.2 (ref 0.0–1.0)

## 2015-07-13 LAB — HEPATIC FUNCTION PANEL
ALBUMIN: 4.3 g/dL (ref 3.5–5.2)
ALT: 12 U/L (ref 0–35)
AST: 17 U/L (ref 0–37)
Alkaline Phosphatase: 71 U/L (ref 39–117)
BILIRUBIN TOTAL: 1 mg/dL (ref 0.2–1.2)
Bilirubin, Direct: 0.2 mg/dL (ref 0.0–0.3)
Total Protein: 7.9 g/dL (ref 6.0–8.3)

## 2015-07-13 LAB — LIPID PANEL
CHOLESTEROL: 142 mg/dL (ref 0–200)
HDL: 45 mg/dL (ref >39.00)
LDL Cholesterol: 78 mg/dL (ref 0–99)
NonHDL: 97.27
TRIGLYCERIDES: 97 mg/dL (ref 0.0–149.0)
Total CHOL/HDL Ratio: 3
VLDL: 19.4 mg/dL (ref 0.0–40.0)

## 2015-07-13 LAB — POCT URINALYSIS DIPSTICK
Bilirubin, UA: NEGATIVE
Blood, UA: NEGATIVE
Glucose, UA: NEGATIVE
KETONES UA: NEGATIVE
Nitrite, UA: NEGATIVE
PH UA: 6.5
PROTEIN UA: NEGATIVE
SPEC GRAV UA: 1.02
UROBILINOGEN UA: NEGATIVE

## 2015-07-13 LAB — BASIC METABOLIC PANEL
BUN: 14 mg/dL (ref 6–23)
CHLORIDE: 103 meq/L (ref 96–112)
CO2: 29 meq/L (ref 19–32)
Calcium: 9.6 mg/dL (ref 8.4–10.5)
Creatinine, Ser: 0.74 mg/dL (ref 0.40–1.20)
GFR: 97.82 mL/min (ref >60.00)
GLUCOSE: 68 mg/dL — AB (ref 70–99)
POTASSIUM: 4.1 meq/L (ref 3.5–5.1)
Sodium: 138 mEq/L (ref 135–145)

## 2015-07-13 LAB — TSH: TSH: 1.09 u[IU]/mL (ref 0.35–4.50)

## 2015-07-13 MED ORDER — FLUCONAZOLE 150 MG PO TABS: ORAL_TABLET | ORAL | Status: DC

## 2015-07-13 MED ORDER — NITROFURANTOIN MONOHYD MACRO 100 MG PO CAPS: 100.0000 mg | ORAL_CAPSULE | Freq: Two times a day (BID) | ORAL | Status: DC

## 2015-07-13 NOTE — Addendum Note (Signed)
Addended by: Mervin KungFERGERSON, Deborah Dondero A on: 07/13/2015 11:00 AM   Modules accepted: Orders

## 2015-07-13 NOTE — Assessment & Plan Note (Signed)
Discussed healthy diet, exercise, weight loss. Obtain routine lab work. Pt is instructed to call her gyn to arrange follow up.

## 2015-07-13 NOTE — Patient Instructions (Addendum)
Start Macrodantin for urinary tract infection. Complete lab work prior to leaving.

## 2015-07-13 NOTE — Progress Notes (Signed)
Pre visit review using our clinic review tool, if applicable. No additional management support is needed unless otherwise documented below in the visit note. 

## 2015-07-13 NOTE — Progress Notes (Signed)
Subjective:    Patient ID: Taylor Hicks, female    DOB: 1986/04/26, 28 y.o.   MRN: [id number]  HPI  Patient presents today for complete physical.  Immunizations: tetanus up to date Diet: reports that her diet is healthy Exercise: trying to exercise  Wt Readings from Last 3 Encounters:  07/13/15 200 lb (90.719 kg)  06/05/15 198 lb 4 oz (89.926 kg)  05/29/15 196 lb 12.8 oz (89.268 kg)  Pap Smear: 06/18/14- ASCUS- performed by GYN, reports that this was followed by colposcopy and she is to schedule follow up with GYN. Eye exam and dental are up to date  Reports urinary urgency since this AM.   Review of Systems  Constitutional: Negative for unexpected weight change.  HENT: Negative for hearing loss and rhinorrhea.   Eyes: Negative for visual disturbance.  Respiratory: Negative for cough.   Cardiovascular: Negative for leg swelling.  Gastrointestinal: Negative for diarrhea and constipation.  Genitourinary: Positive for dysuria and frequency.       Heavy periods on paragard  Musculoskeletal: Negative for myalgias and arthralgias.  Skin: Negative for rash.  Neurological: Negative for headaches.  Hematological: Negative for adenopathy.  Psychiatric/Behavioral:       Denies depression/anxiety       Past Medical History  Diagnosis Date  . Allergy   . History of chicken pox     Social History   Social History  . Marital Status: Married    Spouse Name: N/A  . Number of Children: N/A  . Years of Education: N/A   Occupational History  . Not on file.   Social History Main Topics  . Smoking status: Never Smoker   . Smokeless tobacco: Not on file  . Alcohol Use: 0.0 oz/week    0 Standard drinks or equivalent per week     Comment: social   . Drug Use: No  . Sexual Activity: Yes    Birth Control/ Protection: Inserts   Other Topics Concern  . 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 Cesarean section N/A 05/05/2014    Procedure: CESAREAN SECTION;  Surgeon: Leslie Andrea, MD;  Location: [facility];  Service: Obstetrics;  Laterality: N/A;  . Eye surgery  1996  . Eye surgery      Family History  Problem Relation Age of Onset  . Diabetes Father   . Hypertension Father   . Hyperlipidemia Mother   . Diabetes Paternal Grandmother   . Diabetes Paternal Grandfather   . COPD Maternal Grandmother   . Osteoporosis Maternal Grandmother   . Cancer Maternal Grandfather     lung    Allergies  Allergen Reactions  . Benzoyl Peroxide Itching  . Azithromycin Hives    Current Outpatient Prescriptions on File Prior to Visit  Medication Sig Dispense Refill  . cetirizine (ZYRTEC) 10 MG tablet Take 10 mg by mouth daily.     No current facility-administered medications on file prior to visit.    Pulse 65  Temp(Src) 97.8 F (36.6 C) (Oral)  Resp 18  Ht  (1.676 m)  Wt 200 lb (90.719 kg)  BMI 32.30 kg/m2  SpO2 100%  LMP 06/15/2015  Objective:   Physical Exam  Physical Exam  Constitutional: She is oriented to person, place, and time. She appears well-developed and well-nourished. No distress.  HENT:  Head: Normocephalic and atraumatic.  Right Ear: Tympanic membrane and ear canal normal.  Left Ear: Tympanic membrane and ear canal normal.  Mouth/Throat: Oropharynx is clear and moist.  Eyes: Pupils are equal, round, and reactive to light. No scleral icterus.  Neck: Normal range of motion. No thyromegaly present.  Cardiovascular: Normal rate and regular rhythm.   No murmur heard. Pulmonary/Chest: Effort normal and breath sounds normal. No respiratory distress. He has no wheezes. She has no rales. She exhibits no tenderness.  Abdominal: Soft. Bowel sounds are normal. He  exhibits no distension and no mass. There is no tenderness. There is no rebound and no guarding.  Musculoskeletal: She exhibits no edema.  Lymphadenopathy:    She has no cervical adenopathy.  Neurological: She is alert and oriented to person, place, and time.  She exhibits normal muscle tone. Coordination normal.  Skin: Skin is warm and dry.  Psychiatric: She has a normal mood and affect. Her behavior is normal. Judgment and thought content normal.  Breasts: Examined lying Right: Without masses, retractions, discharge or axillary adenopathy.  Left: Without masses, retractions, discharge or axillary adenopathy.  Pelvic: deferred.          Assessment & Plan:   UTI- UA + leuks. Will send urine for culture and start macrobid.  Pt requests rx for diflucan in case of yeast infection from macrobid.      Assessment & Plan:

## 2015-07-14 LAB — CBC WITH DIFFERENTIAL/PLATELET
BASOS ABS: 0 10*3/uL (ref 0.0–0.1)
Basophils Relative: 0.8 % (ref 0.0–3.0)
EOS PCT: 1.9 % (ref 0.0–5.0)
Eosinophils Absolute: 0.1 10*3/uL (ref 0.0–0.7)
HCT: 39.5 % (ref 36.0–46.0)
Hemoglobin: 12.8 g/dL (ref 12.0–15.0)
LYMPHS ABS: 1.8 10*3/uL (ref 0.7–4.0)
Lymphocytes Relative: 33.4 % (ref 12.0–46.0)
MCHC: 32.4 g/dL (ref 30.0–36.0)
MCV: 83.3 fl (ref 78.0–100.0)
MONOS PCT: 5.9 % (ref 3.0–12.0)
Monocytes Absolute: 0.3 10*3/uL (ref 0.1–1.0)
NEUTROS ABS: 3.1 10*3/uL (ref 1.4–7.7)
NEUTROS PCT: 58 % (ref 43.0–77.0)
PLATELETS: 155 10*3/uL (ref 150.0–400.0)
RBC: 4.74 Mil/uL (ref 3.87–5.11)
RDW: 12.6 % (ref 11.5–15.5)
WBC: 5.3 10*3/uL (ref 4.0–10.5)

## 2015-07-15 LAB — URINE CULTURE: Colony Count: 40000

## 2015-07-17 ENCOUNTER — Encounter: Payer: Self-pay | Admitting: Family

## 2015-08-18 ENCOUNTER — Ambulatory Visit (INDEPENDENT_AMBULATORY_CARE_PROVIDER_SITE_OTHER): Payer: 59 | Admitting: Family Medicine

## 2015-08-18 ENCOUNTER — Encounter: Payer: Self-pay | Admitting: Family Medicine

## 2015-08-18 VITALS — BP 126/80 | HR 88 | Temp 98.0°F | Resp 18 | Wt 207.0 lb

## 2015-08-18 DIAGNOSIS — J0100 Acute maxillary sinusitis, unspecified: Secondary | ICD-10-CM | POA: Insufficient documentation

## 2015-08-18 MED ORDER — BENZONATATE 200 MG PO CAPS: 200.0000 mg | ORAL_CAPSULE | Freq: Three times a day (TID) | ORAL | Status: DC | PRN

## 2015-08-18 MED ORDER — AMOXICILLIN 875 MG PO TABS: 875.0000 mg | ORAL_TABLET | Freq: Two times a day (BID) | ORAL | Status: DC

## 2015-08-18 MED ORDER — PROMETHAZINE-DM 6.25-15 MG/5ML PO SYRP: 5.0000 mL | ORAL_SOLUTION | Freq: Four times a day (QID) | ORAL | Status: DC | PRN

## 2015-08-18 NOTE — Patient Instructions (Signed)
Follow up as needed Start the Amoxicillin twice daily- take w/ food Drink plenty of fluids REST! Use the cough syrup for nights/weekends- will cause drowsiness Tessalon (cough pill) for daytime cough Call with any questions or concerns Hang in there!!!

## 2015-08-18 NOTE — Progress Notes (Signed)
Pre visit review using our clinic review tool, if applicable. No additional management support is needed unless otherwise documented below in the visit note. 

## 2015-08-18 NOTE — Assessment & Plan Note (Signed)
New.  Pt's sxs and PE consistent w/ infxn.  Start abx.  Cough meds prn.  Reviewed supportive care and red flags that should prompt return.  Pt expressed understanding and is in agreement w/ plan.  

## 2015-08-18 NOTE — Progress Notes (Signed)
   Subjective:    Patient ID: Taylor Hicks, female    DOB: 05-21-1986, 29 y.o.   MRN: [id number]  HPI URI- 'i've got a terrible head cold'.  sxs started 12/25 w/ nasal congestion and drainage.  Took Mucinex w/ some improvement.  Feeling worse yesterday.  Started Sudafed and Tylenol cold w/o improvement.  + sinus pain/pressure.  + body aches.  No fevers.  No tooth pain.  + sick contacts.  Intermittent productive cough.  L ear pain.   Review of Systems For ROS see HPI     Objective:   Physical Exam  Constitutional: She appears well-developed and well-nourished. No distress.  HENT:  Head: Normocephalic and atraumatic.  Right Ear: Tympanic membrane normal.  Left Ear: Tympanic membrane normal.  Nose: Mucosal edema and rhinorrhea present. Right sinus exhibits maxillary sinus tenderness and frontal sinus tenderness. Left sinus exhibits maxillary sinus tenderness and frontal sinus tenderness.  Mouth/Throat: Uvula is midline and mucous membranes are normal. Posterior oropharyngeal erythema present. No oropharyngeal exudate.  Eyes: Conjunctivae and EOM are normal. Pupils are equal, round, and reactive to light.  Neck: Normal range of motion. Neck supple.  Cardiovascular: Normal rate, regular rhythm and normal heart sounds.   Pulmonary/Chest: Effort normal and breath sounds normal. No respiratory distress. She has no wheezes.  Lymphadenopathy:    She has no cervical adenopathy.  Vitals reviewed.         Assessment & Plan:

## 2015-08-22 ENCOUNTER — Telehealth: Payer: Self-pay | Admitting: Family

## 2015-08-22 DIAGNOSIS — N3000 Acute cystitis without hematuria: Secondary | ICD-10-CM

## 2015-08-22 MED ORDER — SULFAMETHOXAZOLE-TRIMETHOPRIM 800-160 MG PO TABS: 1.0000 | ORAL_TABLET | Freq: Two times a day (BID) | ORAL | Status: DC

## 2015-08-22 NOTE — Progress Notes (Signed)

## 2015-08-24 ENCOUNTER — Telehealth: Payer: 59 | Admitting: Family

## 2015-08-24 DIAGNOSIS — A499 Bacterial infection, unspecified: Secondary | ICD-10-CM

## 2015-08-24 DIAGNOSIS — N390 Urinary tract infection, site not specified: Principal | ICD-10-CM

## 2015-08-24 NOTE — Progress Notes (Signed)
Based on what you shared with me it looks like you have a serious condition that should be evaluated in a face to face office visit.  If you are having a true medical emergency please call 911.  If you need an urgent face to face visit, [facility] has four urgent care centers for your convenience.  . [facility]  [phone] Get Driving Directions Find a Provider at this Location  [address] [place], [place] [address] . 8 am to 8 pm Monday-Friday . 9 am to 7 pm Saturday-Sunday  . [facility]  [phone] Get Driving Directions Find a Provider at this Location  [address], [address] [place], [place] [address] . 8 am to 8 pm Monday-Friday . 9 am to 6 pm Saturday . 11 am to 6 pm Sunday   . [facility] [place]  [phone] Get Driving Directions  [address] [place], [place] [address] . 8 am to 8 pm Monday-Friday . 9 am to 4 pm Saturday-Sunday   . [facility] (a walk in primary care provider)  [phone]  Get Driving Directions Find a Provider at this Location  [address] [place],  [address] . 8 am to 8:30 pm Monday-Thursday . 8 am to 6 pm Friday . 8 am to 4 pm Saturday-Sunday   Your e-visit answers were reviewed by a board certified advanced clinical practitioner to complete your personal care plan.  Thank you for using e-Visits. 

## 2015-08-27 ENCOUNTER — Encounter: Payer: Self-pay | Admitting: Family

## 2015-08-28 MED ORDER — NITROFURANTOIN MACROCRYSTAL 50 MG PO CAPS: ORAL_CAPSULE | ORAL | Status: DC

## 2015-09-04 ENCOUNTER — Ambulatory Visit (INDEPENDENT_AMBULATORY_CARE_PROVIDER_SITE_OTHER): Payer: 59 | Admitting: Medical

## 2015-09-04 ENCOUNTER — Encounter: Payer: Self-pay | Admitting: Medical

## 2015-09-04 VITALS — BP 116/78 | HR 71 | Temp 98.1°F | Ht 66.0 in | Wt 199.0 lb

## 2015-09-04 DIAGNOSIS — R0981 Nasal congestion: Secondary | ICD-10-CM

## 2015-09-04 DIAGNOSIS — H6983 Other specified disorders of Eustachian tube, bilateral: Secondary | ICD-10-CM

## 2015-09-04 DIAGNOSIS — R05 Cough: Secondary | ICD-10-CM | POA: Diagnosis not present

## 2015-09-04 DIAGNOSIS — R059 Cough, unspecified: Secondary | ICD-10-CM

## 2015-09-04 MED ORDER — CEFDINIR 300 MG PO CAPS: 300.0000 mg | ORAL_CAPSULE | Freq: Two times a day (BID) | ORAL | Status: DC

## 2015-09-04 NOTE — Patient Instructions (Signed)
You appear to have ear pressure related to recent sinus infection. By exam I think you sinus infection has cleared. You could use afrin for 2 days to decrease nasal congestion and this  may help relieve ear pressure. But no more than 2 day use. Also restart your flonase spray. If ear pressure worsen then could consider more aggressive treatment.   For cough use your cough tabs.  If over the weekend  you get recurrent bacterial infection symptoms as discussed then could start cefdnir but not necessary now.  Follow up in 7-10 days or as needed

## 2015-09-04 NOTE — Progress Notes (Signed)
Subjective:    Patient ID: Taylor Hicks, female    DOB: [birth year], 30 y.o.   MRN: [id number]  HPI  . Pt states she has been on amoxicllin. For 10 days and finished it this past Friday. Pt was dx sinus infection. Pt states her sinus pressure feels better. Pt is coughing some. Pt had some ear pressure since course of sinus infection. But never went away. Recent increase in pressure sensation. Pt tried some otc ear drops to try to help with ear pressure. She feels like it did help some.  Some cough but not productive. She feels like it could be productive. Some eye itching. Some occasional sneezing. Pt has flonase but not using over past 3-4 days.  LMP- Aug 16, 2015.    Review of Systems  Constitutional: Negative for fever, chills and fatigue.  HENT: Positive for congestion and sneezing. Negative for ear discharge, nosebleeds, postnasal drip, rhinorrhea, sinus pressure and sore throat.        Residual nasal congestion.  Ear pressure.  Respiratory: Positive for cough. Negative for choking and chest tightness.        Rare cough.  Cardiovascular: Negative for chest pain and palpitations.  Gastrointestinal: Negative for abdominal pain.  Musculoskeletal: Negative for back pain.  Skin: Negative for pallor.  Neurological: Negative for dizziness and headaches.  Hematological: Negative for adenopathy. Does not bruise/bleed easily.  Psychiatric/Behavioral: Negative for behavioral problems and confusion.     Past Medical History  Diagnosis Date  . Allergy   . History of chicken pox     Social History   Social History  . Marital Status: Married    Spouse Name: N/A  . Number of Children: N/A  . Years of Education: N/A   Occupational History  . Not on file.   Social History Main Topics  . Smoking status: Never Smoker   . Smokeless tobacco: Not on file  . Alcohol Use: 0.0 oz/week    0 Standard drinks or equivalent per week     Comment: social   . Drug Use: No  . Sexual  Activity: Yes    Birth Control/ Protection: Inserts   Other Topics Concern  . 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 Cesarean section N/A 05/05/2014    Procedure: CESAREAN SECTION;  Surgeon: Leslie Andrea, MD;  Location: [facility];  Service: Obstetrics;  Laterality: N/A;  . Eye surgery  1996  . Eye surgery      Family History  Problem Relation Age of Onset  . Diabetes Father   . Hypertension Father   . Hyperlipidemia Mother   . Diabetes Paternal Grandmother   . Diabetes Paternal Grandfather   . COPD Maternal Grandmother   . Osteoporosis Maternal Grandmother   . Cancer Maternal Grandfather     lung    Allergies  Allergen Reactions  . Benzoyl Peroxide Itching  . Azithromycin Hives    Current Outpatient Prescriptions on File Prior to Visit  Medication Sig Dispense Refill  . benzonatate (TESSALON) 200 MG capsule Take 1 capsule (200 mg total) by mouth 3 (three) times daily as needed for cough. 60 capsule 0  . cetirizine (ZYRTEC) 10  MG tablet Take 10 mg by mouth daily.    . nitrofurantoin (MACRODANTIN) 50 MG capsule 1 cap PO after intercourse. 30 capsule 2  . promethazine-dextromethorphan (PROMETHAZINE-DM) 6.25-15 MG/5ML syrup Take 5 mLs by mouth 4 (four) times daily as needed. 240 mL 0   No current facility-administered medications on file prior to visit.    BP 116/78 mmHg  Pulse 71  Temp(Src) 98.1 F (36.7 C) (Oral)  Ht 5\' 6"  (1.676 m)  Wt 199 lb (90.266 kg)  BMI 32.13 kg/m2  SpO2 99%  LMP 08/18/2015       Objective:   Physical Exam  General  Mental Status - Alert. General Appearance - Well groomed. Not in acute distress.  Skin Rashes- No Rashes.  HEENT Head- Normal. Ear Auditory Canal -  Left- Normal. Right - Normal.Tympanic Membrane- Left- Normal. Right- Normal. Eye Sclera/Conjunctiva- Left- Normal. Right- Normal. Nose & Sinuses Nasal Mucosa- Left-  Boggy and Congested. Right-  Boggy and  Congested.Bilateral no maxillary and no frontal sinus pressure. Mouth & Throat Lips: Upper Lip- Normal: no dryness, cracking, pallor, cyanosis, or vesicular eruption. Lower Lip-Normal: no dryness, cracking, pallor, cyanosis or vesicular eruption. Buccal Mucosa- Bilateral- No Aphthous ulcers. Oropharynx- No Discharge or Erythema. Tonsils: Characteristics- Bilateral- No Erythema or Congestion. Size/Enlargement- Bilateral- No enlargement. Discharge- bilateral-None.  Neck Neck- Supple. No Masses.   Chest and Lung Exam Auscultation: Breath Sounds:-Clear even and unlabored.  Cardiovascular Auscultation:Rythm- Regular, rate and rhythm. Murmurs & Other Heart Sounds:Ausculatation of the heart reveal- No Murmurs.  Lymphatic Head & Neck General Head & Neck Lymphatics: Bilateral: Description- No Localized lymphadenopathy.       Assessment & Plan:  You appear to have ear pressure related to recent sinus infection. By exam I think you sinus infection has cleared. You could use afrin for 2 days to decrease nasal congestion and this  may help relieve ear pressure. But no more than 2 day use. Also restart your flonase spray. If ear pressure worsen then could consider more aggressive treatment.   For cough use your cough tabs.  If over the weekend  you get recurrent bacterial infection symptoms as discussed then could start cefdnir but not necessary now.  Follow up in 7-10 days or as needed

## 2015-09-04 NOTE — Progress Notes (Signed)
Pre visit review using our clinic review tool, if applicable. No additional management support is needed unless otherwise documented below in the visit note. 

## 2015-09-08 ENCOUNTER — Telehealth: Payer: Self-pay | Admitting: Family

## 2015-09-08 MED ORDER — FLUCONAZOLE 150 MG PO TABS: ORAL_TABLET | ORAL | Status: DC

## 2015-09-08 NOTE — Telephone Encounter (Signed)
Caller name:Self  Can be reached: [phone]  Pharmacy: [facility]  Address: [address] [place], [place] [address]  Phone: [phone]   Reason for call: Patient requesting a Diflucan rx because she started taking an antibiotic on Sunday and believes she has a yeast infection

## 2015-09-11 ENCOUNTER — Telehealth: Payer: Self-pay | Admitting: Family

## 2015-09-11 ENCOUNTER — Ambulatory Visit (INDEPENDENT_AMBULATORY_CARE_PROVIDER_SITE_OTHER): Payer: 59 | Admitting: Family

## 2015-09-11 ENCOUNTER — Encounter: Payer: Self-pay | Admitting: Family

## 2015-09-11 VITALS — BP 114/62 | HR 76 | Temp 97.7°F | Resp 16 | Ht 66.0 in | Wt 202.0 lb

## 2015-09-11 DIAGNOSIS — R059 Cough, unspecified: Secondary | ICD-10-CM

## 2015-09-11 DIAGNOSIS — R05 Cough: Secondary | ICD-10-CM

## 2015-09-11 DIAGNOSIS — H6983 Other specified disorders of Eustachian tube, bilateral: Secondary | ICD-10-CM

## 2015-09-11 NOTE — Telephone Encounter (Signed)
Opened in error

## 2015-09-11 NOTE — Progress Notes (Signed)
Subjective:    Patient ID: Taylor Hicks, female    DOB: [birth year], 30 y.o.   MRN: [id number]  HPI  Taylor Hicks is a 30 yr old female who presents today with chief complaint of cough. She presented on 1/13 following a 10 day course of abx for sinus infection with c/o otalgia. It was felt that she had eustachian tube dysfunction and it was recommended that she begin short term afrin and restart flonase.  She reports that ear pain improved. Reports Sunday her left ear pain worsened. She began the cefdinir.  Initially symptoms improved.  Yesterday she noted cough/drainage.  Had solid yellow sputum.     Review of Systems    see HPI  Past Medical History  Diagnosis Date  . Allergy   . History of chicken pox     Social History   Social History  . Marital Status: Married    Spouse Name: N/A  . Number of Children: N/A  . Years of Education: N/A   Occupational History  . Not on file.   Social History Main Topics  . Smoking status: Never Smoker   . Smokeless tobacco: Not on file  . Alcohol Use: 0.0 oz/week    0 Standard drinks or equivalent per week     Comment: social   . Drug Use: No  . Sexual Activity: Yes    Birth Control/ Protection: Inserts   Other Topics Concern  . 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 Cesarean section N/A 05/05/2014    Procedure: CESAREAN SECTION;  Surgeon: Leslie Andrea, MD;  Location: [facility];  Service: Obstetrics;  Laterality: N/A;  . Eye surgery  1996  . Eye surgery      Family History  Problem Relation Age of Onset  . Diabetes Father   . Hypertension Father   . Hyperlipidemia Mother   . Diabetes Paternal Grandmother   . Diabetes Paternal Grandfather   . COPD Maternal  Grandmother   . Osteoporosis Maternal Grandmother   . Cancer Maternal Grandfather     lung    Allergies  Allergen Reactions  . Benzoyl Peroxide Itching  . Azithromycin Hives    Current Outpatient Prescriptions on File Prior to Visit  Medication Sig Dispense Refill  . benzonatate (TESSALON) 200 MG capsule Take 1 capsule (200 mg total) by mouth 3 (three) times daily as needed for cough. 60 capsule 0  . cefdinir (OMNICEF) 300 MG capsule Take 1 capsule (300 mg total) by mouth 2 (two) times daily. 20 capsule 0  . cetirizine (ZYRTEC) 10 MG tablet Take 10 mg by mouth daily.    . nitrofurantoin (MACRODANTIN) 50 MG capsule 1 cap PO after intercourse. 30 capsule 2  . fluconazole (DIFLUCAN) 150 MG tablet 1 tab by mouth today, may repeat in 3 days if symptoms not resolved (Patient not taking: Reported on 09/11/2015) 2 tablet 0   No current facility-administered medications on file prior to visit.    BP 114/62 mmHg  Pulse 76  Temp(Src) 97.7 F (36.5 C) (Oral)  Resp 16  Ht  (1.676 m)  Wt 202 lb (91.627 kg)  BMI 32.62 kg/m2  SpO2 100%  LMP 09/08/2015    Objective:   Physical Exam  Constitutional: She is oriented to person, place, and time. She appears well-developed and well-nourished.  HENT:  Head: Normocephalic and atraumatic.  Serous effusions bilaterally, no erythema, no bulging.  Cardiovascular: Normal rate, regular rhythm and normal heart sounds.   No murmur heard. Pulmonary/Chest: Effort normal and breath sounds normal. No respiratory distress. She has no wheezes.  Musculoskeletal: She exhibits no edema.  Neurological: She is alert and oriented to person, place, and time.  Psychiatric: She has a normal mood and affect. Her behavior is normal. Judgment and thought content normal.          Assessment & Plan:  Cough- suspect related to resolving sinusitis.  Also suspect some eustachian tube dysfunction. Advised pt as follows:  Continue cefdinir. Continue netti pot and  increase flonase to 2 sprays each nostril once daily. Call if symptoms worsen or if symptoms do not continue to improve.

## 2015-09-11 NOTE — Telephone Encounter (Signed)
Please contact pt re: unread mychart message. 

## 2015-09-11 NOTE — Progress Notes (Signed)
Pre visit review using our clinic review tool, if applicable. No additional management support is needed unless otherwise documented below in the visit note. 

## 2015-09-11 NOTE — Telephone Encounter (Signed)
Notified pt and she states she already picked up the medication.  Seen last Friday for ears popping. Given cefdinir and used Afrin. Helped some but by Sunday 1 ear felt like it would explode. Now has productive cough with yellow sputum and doesn't feel like symptoms are improving.  Scheduled pt appt with PCP today at 1:30pm.

## 2015-09-11 NOTE — Patient Instructions (Signed)
Continue cefdinir. Continue netti pot and increase flonase to 2 sprays each nostril once daily. Call if symptoms worsen or if symptoms do not continue to improve.

## 2015-10-12 ENCOUNTER — Ambulatory Visit (INDEPENDENT_AMBULATORY_CARE_PROVIDER_SITE_OTHER): Payer: 59 | Admitting: Medical

## 2015-10-12 ENCOUNTER — Encounter: Payer: Self-pay | Admitting: Medical

## 2015-10-12 VITALS — BP 112/76 | HR 78 | Temp 98.4°F | Ht 66.0 in | Wt 202.4 lb

## 2015-10-12 DIAGNOSIS — J0100 Acute maxillary sinusitis, unspecified: Secondary | ICD-10-CM

## 2015-10-12 DIAGNOSIS — R05 Cough: Secondary | ICD-10-CM

## 2015-10-12 DIAGNOSIS — J309 Allergic rhinitis, unspecified: Secondary | ICD-10-CM | POA: Diagnosis not present

## 2015-10-12 DIAGNOSIS — H6691 Otitis media, unspecified, right ear: Secondary | ICD-10-CM | POA: Diagnosis not present

## 2015-10-12 DIAGNOSIS — R059 Cough, unspecified: Secondary | ICD-10-CM

## 2015-10-12 MED ORDER — AMOXICILLIN-POT CLAVULANATE 875-125 MG PO TABS: 1.0000 | ORAL_TABLET | Freq: Two times a day (BID) | ORAL | Status: DC

## 2015-10-12 MED ORDER — FLUTICASONE PROPIONATE 50 MCG/ACT NA SUSP: 2.0000 | Freq: Every day | NASAL | Status: DC

## 2015-10-12 MED ORDER — MONTELUKAST SODIUM 10 MG PO TABS: 10.0000 mg | ORAL_TABLET | Freq: Every day | ORAL | Status: DC

## 2015-10-12 MED ORDER — AZELASTINE HCL 0.1 % NA SOLN: 2.0000 | Freq: Two times a day (BID) | NASAL | Status: DC

## 2015-10-12 NOTE — Progress Notes (Signed)
Subjective:    Patient ID: Taylor Hicks, female    DOB: [birth year], 30 y.o.   MRN: [id number]  HPI   Pt in for some persisting sinus pressure. I saw pt 5 wks ago and she had some eutachian tube pressure and sinus pressure. Pt saw Melissa a month ago and she states continue rx of cefdnir I gave. She eventually had some persisisting symptoms for another week. She went to urgent care and they gave 5 days of prednisone. She states she felt well. But then symptoms of nasal congestion area gradually coming back.  Pt states she is blowing out mucous and coughing up some mucous as well. Coughin up mucous more in the morning.  LMP- currently on it.  Pt does have allergies this time of year. She takes zyrtec daily.   Review of Systems  Constitutional: Negative for fever, chills and fatigue.  HENT: Positive for congestion, ear discharge, rhinorrhea, sinus pressure and sneezing. Negative for postnasal drip and sore throat.        Rare sneeze.  Respiratory: Positive for cough. Negative for apnea, chest tightness, shortness of breath and wheezing.   Cardiovascular: Negative for chest pain and palpitations.  Musculoskeletal: Negative for myalgias and back pain.  Skin: Negative for rash.  Hematological: Negative for adenopathy. Does not bruise/bleed easily.  Psychiatric/Behavioral: Negative for behavioral problems and confusion.    Past Medical History  Diagnosis Date  . Allergy   . History of chicken pox     Social History   Social History  . Marital Status: Married    Spouse Name: N/A  . Number of Children: N/A  . Years of Education: N/A   Occupational History  . Not on file.   Social History Main Topics  . Smoking status: Never Smoker   . Smokeless tobacco: Not on file  . Alcohol Use: 0.0 oz/week    0 Standard drinks or equivalent per week     Comment: social   . Drug Use: No  . Sexual Activity: Yes    Birth Control/ Protection: Inserts   Other Topics Concern  . 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 Cesarean section N/A 05/05/2014    Procedure: CESAREAN SECTION;  Surgeon: Leslie Andrea, MD;  Location: [facility];  Service: Obstetrics;  Laterality: N/A;  . Eye surgery  1996  . Eye surgery      Family History  Problem Relation Age of Onset  . Diabetes Father   . Hypertension Father   . Hyperlipidemia Mother   . Diabetes Paternal Grandmother   . Diabetes Paternal Grandfather   . COPD Maternal Grandmother   . Osteoporosis Maternal Grandmother   . Cancer Maternal Grandfather     lung    Allergies  Allergen Reactions  . Benzoyl Peroxide Itching  . Azithromycin Hives    Current Outpatient Prescriptions on File Prior to Visit  Medication Sig Dispense Refill  . cetirizine (ZYRTEC) 10 MG tablet Take 10 mg by mouth daily.    . nitrofurantoin (MACRODANTIN) 50 MG capsule 1 cap PO after intercourse. 30 capsule 2   No current facility-administered medications on file prior to visit.  BP 112/76 mmHg  Pulse 78  Temp(Src) 98.4 F (36.9 C) (Oral)  Ht  (1.676 m)  Wt 202 lb 6.4 oz (91.808 kg)  BMI 32.68 kg/m2  SpO2 98%  LMP 10/12/2015       Objective:   Physical Exam  General  Mental Status - Alert. General Appearance - Well groomed. Not in acute distress.  Skin Rashes- No Rashes.  HEENT Head- Normal. Ear Auditory Canal - Left- Normal. Right - Normal.Tympanic Membrane- Left- Normal. Right- mild central redness. Eye Sclera/Conjunctiva- Left- Normal. Right- Normal. Nose & Sinuses Nasal Mucosa- Left-  Boggy and Congested. Right-  Boggy and  Congested.Bilateral maxillary and frontal sinus pressure. Mouth & Throat Lips: Upper Lip- Normal: no dryness, cracking, pallor,  cyanosis, or vesicular eruption. Lower Lip-Normal: no dryness, cracking, pallor, cyanosis or vesicular eruption. Buccal Mucosa- Bilateral- No Aphthous ulcers. Oropharynx- No Discharge or Erythema. Tonsils: Characteristics- Bilateral- No Erythema or Congestion. Size/Enlargement- Bilateral- No enlargement. Discharge- bilateral-None.  Neck Neck- Supple. No Masses.   Chest and Lung Exam Auscultation: Breath Sounds:-Clear even and unlabored.  Cardiovascular Auscultation:Rythm- Regular, rate and rhythm. Murmurs & Other Heart Sounds:Ausculatation of the heart reveal- No Murmurs.  Lymphatic Head & Neck General Head & Neck Lymphatics: Bilateral: Description- No Localized lymphadenopathy.       Assessment & Plan:  You have 5 wks of suspicious symptoms of allergic rhinitis, ear pressure mixed with possible sinus infection and possilbe bronchitis symptoms. Today is 4th visit since January 13th.  Will refill astelin, add flonase, continue otc antihistamine and adding montelukast. Will provide refills for 3 months. Continue these throughout spring.  Will go ahead and rx augmentin for OM and possible sinusitis.  Follow up in 10-14 days or as needed  Advised use probiotics while on then antibiotic.

## 2015-10-12 NOTE — Progress Notes (Signed)
Pre visit review using our clinic review tool, if applicable. No additional management support is needed unless otherwise documented below in the visit note. 

## 2015-10-12 NOTE — Patient Instructions (Addendum)
You have 5 wks of suspicious symptoms of allergic rhinitis, ear pressure mixed with possible sinus infection and possilbe bronchitis symptoms. Today is 4th visit since January 13th.  Will refill astelin, add flonase, continue otc antihistamine and adding montelukast. Will provide refills for 3 months. Continue these throughout spring.  Will go ahead and rx augmentin for OM and possible sinusitis.  Follow up in 10-14 days or as needed  Advised use probiotics while on then antibiotic.

## 2016-02-06 ENCOUNTER — Other Ambulatory Visit: Payer: Self-pay | Admitting: Medical

## 2016-02-08 NOTE — Telephone Encounter (Signed)
Rx refilled 02/08/16.

## 2016-03-25 ENCOUNTER — Other Ambulatory Visit: Payer: Self-pay | Admitting: Medical

## 2016-06-03 ENCOUNTER — Ambulatory Visit (INDEPENDENT_AMBULATORY_CARE_PROVIDER_SITE_OTHER): Payer: 59 | Admitting: Family

## 2016-06-03 DIAGNOSIS — Z23 Encounter for immunization: Secondary | ICD-10-CM | POA: Diagnosis not present

## 2016-07-19 ENCOUNTER — Encounter: Payer: 59 | Admitting: Family

## 2016-07-25 ENCOUNTER — Encounter: Payer: Self-pay | Admitting: Family

## 2016-07-25 ENCOUNTER — Other Ambulatory Visit: Payer: Self-pay | Admitting: Family

## 2016-07-25 MED ORDER — FLUCONAZOLE 150 MG PO TABS: 150.0000 mg | ORAL_TABLET | Freq: Once | ORAL | 0 refills | Status: DC

## 2016-07-26 MED ORDER — FLUCONAZOLE 150 MG PO TABS: 150.0000 mg | ORAL_TABLET | Freq: Once | ORAL | 0 refills | Status: AC

## 2016-07-26 NOTE — Telephone Encounter (Signed)
Attempted to reach [facility] to cancel diflucan Rx that was sent to them in error and call was disconnected 3 times. Re-sent Rx to [facility]. Faxed cancellation notice to [facility].

## 2016-08-01 ENCOUNTER — Ambulatory Visit (INDEPENDENT_AMBULATORY_CARE_PROVIDER_SITE_OTHER): Payer: 59 | Admitting: Family

## 2016-08-01 ENCOUNTER — Encounter: Payer: Self-pay | Admitting: Family

## 2016-08-01 VITALS — BP 123/77 | HR 85 | Temp 98.3°F | Resp 18 | Ht 66.0 in | Wt 200.6 lb

## 2016-08-01 DIAGNOSIS — Z0000 Encounter for general adult medical examination without abnormal findings: Secondary | ICD-10-CM

## 2016-08-01 NOTE — Progress Notes (Signed)
Subjective:    Patient ID: Taylor Hicks, female    DOB: June 14, 1986, 30 y.o.   MRN: [id number]  HPI   Patient presents today for complete physical. This year her husband unfortunately suffered a c-spine injury and is now wheelchair bound. She has been his primary caregiver.   Immunizations: tetanus up to date.  Diet:diet could be better Exercise: not exercising recently Pap Smear: 2015 normal ([facility])  Wt Readings from Last 3 Encounters:  08/01/16 200 lb 9.6 oz (91 kg)  10/12/15 202 lb 6.4 oz (91.8 kg)  09/11/15 202 lb (91.6 kg)       Review of Systems  Constitutional: Negative for unexpected weight change.  HENT: Negative for hearing loss and rhinorrhea.        Some bilateral ear pressure  Eyes: Negative for visual disturbance.  Respiratory: Negative for cough.   Cardiovascular: Negative for leg swelling.  Gastrointestinal: Negative for constipation and diarrhea.  Genitourinary: Negative for dysuria, frequency and vaginal discharge.  Musculoskeletal: Negative for arthralgias and myalgias.  Skin: Negative for rash.  Neurological: Negative for headaches.  Hematological: Negative for adenopathy.  Psychiatric/Behavioral:       Denies depression/anxiety       Past Medical History:  Diagnosis Date  . Allergy   . History of chicken pox      Social History   Social History  . Marital status: Married    Spouse name: N/A  . Number of children: N/A  . Years of education: N/A   Occupational History  . Not on file.   Social History Main Topics  . Smoking status: Never Smoker  . Smokeless tobacco: Never Used  . Alcohol use 0.0 oz/week     Comment: social   . Drug use: No  . Sexual activity: Yes    Birth control/ protection: Inserts   Other Topics Concern  . 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 CESAREAN SECTION N/A 05/05/2014   Procedure: CESAREAN SECTION;  Surgeon: Leslie AndreaJames E Tomblin II, MD;  Location: [facility];  Service: Obstetrics;  Laterality: N/A;  . EYE SURGERY  1996  . EYE SURGERY      Family History  Problem Relation Age of Onset  . Diabetes Father   . Hypertension Father   . Hyperlipidemia Mother   . Diabetes Paternal Grandmother   . Diabetes Paternal Grandfather   . COPD Maternal Grandmother   . Osteoporosis Maternal Grandmother   . Cancer Maternal Grandfather     lung    Allergies  Allergen Reactions  . Benzoyl Peroxide Itching  . Azithromycin Hives    Current Outpatient Prescriptions on File Prior to Visit  Medication Sig Dispense Refill  . azelastine (ASTELIN) 0.1 % nasal spray Place 2 sprays into both nostrils 2 (two) times daily. Use in each nostril as directed 30 mL 3  . cetirizine (ZYRTEC) 10 MG tablet Take 10 mg by mouth daily.    . fluticasone (FLONASE) 50 MCG/ACT nasal spray Place 2 sprays into both nostrils daily. 16 g 1  . montelukast (SINGULAIR) 10 MG tablet Take 1 tablet (10 mg total) by mouth at bedtime. (Patient taking differently: Take 1  tablet (10 mg total) by mouth at bedtime as needed.) 30 tablet 3  . nitrofurantoin (MACRODANTIN) 50 MG capsule 1 cap PO after intercourse. 30 capsule 2   No current facility-administered medications on file prior to visit.     BP 123/77 (BP Location: Right Arm, Cuff Size: Large)   Pulse 85   Temp 98.3 F (36.8 C) (Oral)   Resp 18   Ht 5\' 6"  (1.676 m)   Wt 200 lb 9.6 oz (91 kg)   LMP 07/08/2016   SpO2 100% Comment: room air  BMI 32.38 kg/m    Objective:   Physical Exam Physical Exam  Constitutional: She is oriented to person, place, and time. She appears well-developed and well-nourished. No distress.  HENT:  Head: Normocephalic and atraumatic.  Right Ear: Tympanic membrane and  ear canal normal.  Left Ear: Tympanic membrane and ear canal normal.  Mouth/Throat: Oropharynx is clear and moist.  Eyes: Pupils are equal, round, and reactive to light. No scleral icterus.  Neck: Normal range of motion. No thyromegaly present.  Cardiovascular: Normal rate and regular rhythm.   No murmur heard. Pulmonary/Chest: Effort normal and breath sounds normal. No respiratory distress. He has no wheezes. She has no rales. She exhibits no tenderness.  Abdominal: Soft. Bowel sounds are normal. She exhibits no distension and no mass. There is no tenderness. There is no rebound and no guarding.  Musculoskeletal: She exhibits no edema.  Lymphadenopathy:    She has no cervical adenopathy.  Neurological: She is alert and oriented to person, place, and time. She has normal patellar reflexes. She exhibits normal muscle tone. Coordination normal.  Skin: Skin is warm and dry.  Psychiatric: She has a normal mood and affect. Her behavior is normal. Judgment and thought content normal.  Breast/pelvic: deferred to GYN        Assessment & Plan:          Assessment & Plan:  Preventative Care- discussed healthy diet, exercise, weight loss.  Obtain routine lab work. Sees GYN tomorrow for her annual visit.

## 2016-08-01 NOTE — Patient Instructions (Addendum)
Please complete lab work prior to leaving. Continue to work on healthy diet, exercise, weight loss.  

## 2016-08-01 NOTE — Progress Notes (Signed)
Pre visit review using our clinic review tool, if applicable. No additional management support is needed unless otherwise documented below in the visit note. 

## 2016-08-02 ENCOUNTER — Encounter: Payer: Self-pay | Admitting: Family

## 2016-08-02 LAB — BASIC METABOLIC PANEL WITH GFR
BUN: 13 mg/dL (ref 6–23)
CO2: 26 meq/L (ref 19–32)
Calcium: 9.2 mg/dL (ref 8.4–10.5)
Chloride: 104 meq/L (ref 96–112)
Creatinine, Ser: 0.7 mg/dL (ref 0.40–1.20)
GFR: 103.57 mL/min
Glucose, Bld: 95 mg/dL (ref 70–99)
Potassium: 3.9 meq/L (ref 3.5–5.1)
Sodium: 138 meq/L (ref 135–145)

## 2016-08-02 LAB — CBC WITH DIFFERENTIAL/PLATELET
Basophils Absolute: 0 K/uL (ref 0.0–0.1)
Basophils Relative: 0.6 % (ref 0.0–3.0)
Eosinophils Absolute: 0.1 K/uL (ref 0.0–0.7)
Eosinophils Relative: 1.5 % (ref 0.0–5.0)
HCT: 38.8 % (ref 36.0–46.0)
Hemoglobin: 13.1 g/dL (ref 12.0–15.0)
Lymphocytes Relative: 31.9 % (ref 12.0–46.0)
Lymphs Abs: 2.4 K/uL (ref 0.7–4.0)
MCHC: 33.7 g/dL (ref 30.0–36.0)
MCV: 83.2 fl (ref 78.0–100.0)
Monocytes Absolute: 0.4 K/uL (ref 0.1–1.0)
Monocytes Relative: 4.8 % (ref 3.0–12.0)
Neutro Abs: 4.6 K/uL (ref 1.4–7.7)
Neutrophils Relative %: 61.2 % (ref 43.0–77.0)
Platelets: 137 K/uL — ABNORMAL LOW (ref 150.0–400.0)
RBC: 4.66 Mil/uL (ref 3.87–5.11)
RDW: 12.9 % (ref 11.5–15.5)
WBC: 7.5 K/uL (ref 4.0–10.5)

## 2016-08-02 LAB — HEPATIC FUNCTION PANEL
ALT: 12 U/L (ref 0–35)
AST: 15 U/L (ref 0–37)
Albumin: 4.4 g/dL (ref 3.5–5.2)
Alkaline Phosphatase: 52 U/L (ref 39–117)
Bilirubin, Direct: 0.1 mg/dL (ref 0.0–0.3)
Total Bilirubin: 0.8 mg/dL (ref 0.2–1.2)
Total Protein: 7.7 g/dL (ref 6.0–8.3)

## 2016-08-02 LAB — URINALYSIS, ROUTINE W REFLEX MICROSCOPIC
Bilirubin Urine: NEGATIVE
Hgb urine dipstick: NEGATIVE
Ketones, ur: NEGATIVE
Leukocytes, UA: NEGATIVE
Nitrite: NEGATIVE
Specific Gravity, Urine: >=1.03 — AB
Total Protein, Urine: NEGATIVE
Urine Glucose: NEGATIVE
Urobilinogen, UA: 0.2
WBC, UA: NONE SEEN
pH: 6 (ref 5.0–8.0)

## 2016-08-02 LAB — LIPID PANEL
Cholesterol: 143 mg/dL (ref 0–200)
HDL: 42 mg/dL
LDL Cholesterol: 84 mg/dL (ref 0–99)
NonHDL: 101.36
Total CHOL/HDL Ratio: 3
Triglycerides: 88 mg/dL (ref 0.0–149.0)
VLDL: 17.6 mg/dL (ref 0.0–40.0)

## 2016-08-02 LAB — TSH: TSH: 0.68 u[IU]/mL (ref 0.35–4.50)

## 2016-09-15 ENCOUNTER — Telehealth: Payer: BC Managed Care – PPO | Admitting: Family

## 2016-09-15 DIAGNOSIS — B9689 Other specified bacterial agents as the cause of diseases classified elsewhere: Secondary | ICD-10-CM

## 2016-09-15 DIAGNOSIS — J329 Chronic sinusitis, unspecified: Secondary | ICD-10-CM

## 2016-09-15 MED ORDER — AMOXICILLIN-POT CLAVULANATE 875-125 MG PO TABS: 1.0000 | ORAL_TABLET | Freq: Two times a day (BID) | ORAL | 0 refills | Status: AC

## 2016-09-15 NOTE — Progress Notes (Signed)

## 2016-09-19 DIAGNOSIS — Z01419 Encounter for gynecological examination (general) (routine) without abnormal findings: Secondary | ICD-10-CM | POA: Diagnosis not present

## 2016-09-19 DIAGNOSIS — N920 Excessive and frequent menstruation with regular cycle: Secondary | ICD-10-CM | POA: Diagnosis not present

## 2016-09-21 DIAGNOSIS — N760 Acute vaginitis: Secondary | ICD-10-CM | POA: Diagnosis not present

## 2016-11-25 DIAGNOSIS — R1031 Right lower quadrant pain: Secondary | ICD-10-CM | POA: Diagnosis not present

## 2017-01-13 ENCOUNTER — Telehealth: Payer: BC Managed Care – PPO | Admitting: Family

## 2017-01-13 DIAGNOSIS — J329 Chronic sinusitis, unspecified: Secondary | ICD-10-CM

## 2017-01-13 DIAGNOSIS — B9689 Other specified bacterial agents as the cause of diseases classified elsewhere: Secondary | ICD-10-CM

## 2017-01-13 MED ORDER — AMOXICILLIN-POT CLAVULANATE 875-125 MG PO TABS: 1.0000 | ORAL_TABLET | Freq: Two times a day (BID) | ORAL | 0 refills | Status: AC

## 2017-01-13 MED ORDER — PREDNISONE 5 MG PO TABS: 5.0000 mg | ORAL_TABLET | ORAL | 0 refills | Status: DC

## 2017-01-13 NOTE — Progress Notes (Signed)
Thank you for the details you put in the comment boxes. Those details really help us take better care of you.   We are sorry that you are not feeling well.  Here is how we plan to help!  Based on what you have shared with me it looks like you have sinusitis.  Sinusitis is inflammation and infection in the sinus cavities of the head.  Based on your presentation I believe you most likely have Acute Bacterial Sinusitis.  This is an infection caused by bacteria and is treated with antibiotics. I have prescribed Augmentin 875mg /125mg  one tablet twice daily with food, for 7 days. You may use an oral decongestant such as Mucinex D or if you have glaucoma or high blood pressure use plain Mucinex. Saline nasal spray help and can safely be used as often as needed for congestion.  If you develop worsening sinus pain, fever or notice severe headache and vision changes, or if symptoms are not better after completion of antibiotic, please schedule an appointment with a health care provider.    I have added a sterapred steroid dose pack (5mg ) taper to help with inflammation.   Sinus infections are not as easily transmitted as other respiratory infection, however we still recommend that you avoid close contact with loved ones, especially the very young and elderly.  Remember to wash your hands thoroughly throughout the day as this is the number one way to prevent the spread of infection!  Home Care:  Only take medications as instructed by your medical team.  Complete the entire course of an antibiotic.  Do not take these medications with alcohol.  A steam or ultrasonic humidifier can help congestion.  You can place a towel over your head and breathe in the steam from hot water coming from a faucet.  Avoid close contacts especially the very young and the elderly.  Cover your mouth when you cough or sneeze.  Always remember to wash your hands.  Get Help Right Away If:  You develop worsening fever or sinus  pain.  You develop a severe head ache or visual changes.  Your symptoms persist after you have completed your treatment plan.  Make sure you  Understand these instructions.  Will watch your condition.  Will get help right away if you are not doing well or get worse.  Your e-visit answers were reviewed by a board certified advanced clinical practitioner to complete your personal care plan.  Depending on the condition, your plan could have included both over the counter or prescription medications.  If there is a problem please reply  once you have received a response from your provider.  Your safety is important to us.  If you have drug allergies check your prescription carefully.    You can use MyChart to ask questions about today's visit, request a non-urgent call back, or ask for a work or school excuse for 24 hours related to this e-Visit. If it has been greater than 24 hours you will need to follow up with your provider, or enter a new e-Visit to address those concerns.  You will get an e-mail in the next two days asking about your experience.  I hope that your e-visit has been valuable and will speed your recovery. Thank you for using e-visits.

## 2017-01-25 ENCOUNTER — Encounter: Payer: Self-pay | Admitting: Family Medicine

## 2017-01-25 ENCOUNTER — Ambulatory Visit (HOSPITAL_BASED_OUTPATIENT_CLINIC_OR_DEPARTMENT_OTHER)
Admission: RE | Admit: 2017-01-25 | Discharge: 2017-01-25 | Disposition: A | Discharge status: Home / Self Care | Payer: 59 | Source: Ambulatory Visit | Attending: Family Medicine | Admitting: Family Medicine

## 2017-01-25 ENCOUNTER — Ambulatory Visit (INDEPENDENT_AMBULATORY_CARE_PROVIDER_SITE_OTHER): Payer: 59 | Admitting: Family Medicine

## 2017-01-25 VITALS — BP 112/74 | HR 83 | Temp 98.0°F | Resp 16 | Ht 66.0 in | Wt 209.8 lb

## 2017-01-25 DIAGNOSIS — J329 Chronic sinusitis, unspecified: Secondary | ICD-10-CM | POA: Insufficient documentation

## 2017-01-25 DIAGNOSIS — J301 Allergic rhinitis due to pollen: Secondary | ICD-10-CM

## 2017-01-25 MED ORDER — FLUCONAZOLE 150 MG PO TABS: 150.0000 mg | ORAL_TABLET | Freq: Once | ORAL | 0 refills | Status: AC

## 2017-01-25 MED ORDER — CLINDAMYCIN HCL 300 MG PO CAPS: ORAL_CAPSULE | ORAL | 0 refills | Status: DC

## 2017-01-25 MED ORDER — PREDNISONE 20 MG PO TABS: ORAL_TABLET | ORAL | 0 refills | Status: DC

## 2017-01-25 NOTE — Progress Notes (Signed)
OFFICE VISIT  01/25/2017   CC:  Chief Complaint  Patient presents with  . URI   HPI:    Patient is a 31 y.o. Caucasian female who presents for months hx of feeling intermittent fluid in ears since Fall 2017.  Taking azelastine and flonase and this helps some.  About 10 d/a she did an e-visit for worsening of these symptoms (+nasal congestion, facial pressure, "sinus HA"), got some dizziness after standing up.  Took augmentin and prednisone rx'd after E-visit.  Now having PND and ST.  +some sinus HA. Takes zytec daily.  Feels like symptoms got worse and then go ta little better.  No pets in home, no known mold in home. Singulair rx'd in the past and it didn't help any.  No saline nasal spray.  No pain in maxillary sinus area or upper teeth. Fall and spring are her worst symptoms.  She has never seen an allergist or had a sinus x-ray.  Past Medical History:  Diagnosis Date  . Allergy   . History of chicken pox     Past Surgical History:  Procedure Laterality Date  . CESAREAN SECTION N/A 05/05/2014   Procedure: CESAREAN SECTION;  Surgeon: Leslie AndreaJames E Tomblin II, MD;  Location: [facility];  Service: Obstetrics;  Laterality: N/A;  . EYE SURGERY  1996  . EYE SURGERY      Outpatient Medications Prior to Visit  Medication Sig Dispense Refill  . azelastine (ASTELIN) 0.1 % nasal spray Place 2 sprays into both nostrils 2 (two) times daily. Use in each nostril as directed 30 mL 3  . cetirizine (ZYRTEC) 10 MG tablet Take 10 mg by mouth daily.    . fluticasone (FLONASE) 50 MCG/ACT nasal spray Place 2 sprays into both nostrils daily. 16 g 1  . montelukast (SINGULAIR) 10 MG tablet Take 1 tablet (10 mg total) by mouth at bedtime. (Patient not taking: Reported on 01/25/2017) 30 tablet 3  . nitrofurantoin (MACRODANTIN) 50 MG capsule 1 cap PO after intercourse. (Patient not taking: Reported on 01/25/2017) 30 capsule 2  . predniSONE (DELTASONE) 5 MG tablet Take 1 tablet (5 mg total) by mouth as directed.  (Patient not taking: Reported on 01/25/2017) 21 tablet 0   No facility-administered medications prior to visit.     Allergies  Allergen Reactions  . Benzoyl Peroxide Itching  . Azithromycin Hives    ROS As per HPI  PE: Blood pressure 112/74, pulse 83, temperature 98 F (36.7 C), temperature source Oral, resp. rate 16, height 5\' 6"  (1.676 m), weight 209 lb 12 oz (95.1 kg), last menstrual period 01/15/2017, SpO2 99 %. VS: noted--normal. Gen: alert, NAD, NONTOXIC APPEARING. HEENT: eyes without injection, drainage, or swelling.  Ears: EACs clear, TMs with normal light reflex and landmarks.  Nose: Clear rhinorrhea, with some dried, crusty exudate adherent to mildly injected mucosa.  No purulent d/c.  Mild paranasal sinus TTP.  No facial swelling.  Throat and mouth without focal lesion.  No pharyngial swelling, erythema, or exudate.   Neck: supple, no LAD.   LUNGS: CTA bilat, nonlabored resps.   CV: RRR, no m/r/g. EXT: no c/c/e SKIN: no rash  LABS:  none  IMPRESSION AND PLAN:  Acute sinusitis, infectious vs allergic vs combo. Discussed possibility that this is viral infection, since augmentin did not help. However, will go ahead and give clindamycin 300mg  tid x 10d.  Will do prednisone 40mg  qd x 5d. Continue flonase, zyrtec, and azelestine. Check plain films of sinuses. Refer to allergist  for further evaluation.  An After Visit Summary was printed and given to the patient.  FOLLOW UP: Return as needed.  Signed:  Santiago Bumpers, MD           01/25/2017

## 2017-01-26 ENCOUNTER — Encounter: Payer: Self-pay | Admitting: *Deleted

## 2017-02-03 DIAGNOSIS — L03011 Cellulitis of right finger: Secondary | ICD-10-CM | POA: Diagnosis not present

## 2017-02-15 DIAGNOSIS — R1031 Right lower quadrant pain: Secondary | ICD-10-CM | POA: Diagnosis not present

## 2017-05-16 DIAGNOSIS — L03011 Cellulitis of right finger: Secondary | ICD-10-CM | POA: Diagnosis not present

## 2017-06-08 ENCOUNTER — Encounter: Payer: Self-pay | Admitting: Family

## 2017-06-08 MED ORDER — FLUCONAZOLE 150 MG PO TABS: ORAL_TABLET | ORAL | 5 refills | Status: DC

## 2017-06-19 ENCOUNTER — Telehealth: Payer: BC Managed Care – PPO | Admitting: Family

## 2017-06-19 DIAGNOSIS — J329 Chronic sinusitis, unspecified: Secondary | ICD-10-CM

## 2017-06-19 DIAGNOSIS — B9689 Other specified bacterial agents as the cause of diseases classified elsewhere: Secondary | ICD-10-CM

## 2017-06-19 MED ORDER — BENZONATATE 100 MG PO CAPS: 100.0000 mg | ORAL_CAPSULE | Freq: Three times a day (TID) | ORAL | 0 refills | Status: DC | PRN

## 2017-06-19 MED ORDER — AMOXICILLIN-POT CLAVULANATE 875-125 MG PO TABS: 1.0000 | ORAL_TABLET | Freq: Two times a day (BID) | ORAL | 0 refills | Status: AC

## 2017-06-19 NOTE — Progress Notes (Signed)
Thank you for the details you included in the comment boxes. Those details are very helpful in determining the best course of treatment for you and help us to provide the best care.  We are sorry that you are not feeling well.  Here is how we plan to help!  Based on what you have shared with me it looks like you have sinusitis.  Sinusitis is inflammation and infection in the sinus cavities of the head.  Based on your presentation I believe you most likely have Acute Bacterial Sinusitis.  This is an infection caused by bacteria and is treated with antibiotics. I have prescribed Augmentin 875mg /125mg  one tablet twice daily with food, for 7 days. You may use an oral decongestant such as Mucinex D or if you have glaucoma or high blood pressure use plain Mucinex. Saline nasal spray help and can safely be used as often as needed for congestion.  If you develop worsening sinus pain, fever or notice severe headache and vision changes, or if symptoms are not better after completion of antibiotic, please schedule an appointment with a health care provider.    I have also prescribed tessalon perles 100mg , take 1-2 every 8 hours as needed for cough.   Sinus infections are not as easily transmitted as other respiratory infection, however we still recommend that you avoid close contact with loved ones, especially the very young and elderly.  Remember to wash your hands thoroughly throughout the day as this is the number one way to prevent the spread of infection!  Home Care:  Only take medications as instructed by your medical team.  Complete the entire course of an antibiotic.  Do not take these medications with alcohol.  A steam or ultrasonic humidifier can help congestion.  You can place a towel over your head and breathe in the steam from hot water coming from a faucet.  Avoid close contacts especially the very young and the elderly.  Cover your mouth when you cough or sneeze.  Always remember to wash  your hands.  Get Help Right Away If:  You develop worsening fever or sinus pain.  You develop a severe head ache or visual changes.  Your symptoms persist after you have completed your treatment plan.  Make sure you  Understand these instructions.  Will watch your condition.  Will get help right away if you are not doing well or get worse.  Your e-visit answers were reviewed by a board certified advanced clinical practitioner to complete your personal care plan.  Depending on the condition, your plan could have included both over the counter or prescription medications.  If there is a problem please reply  once you have received a response from your provider.  Your safety is important to us.  If you have drug allergies check your prescription carefully.    You can use MyChart to ask questions about today's visit, request a non-urgent call back, or ask for a work or school excuse for 24 hours related to this e-Visit. If it has been greater than 24 hours you will need to follow up with your provider, or enter a new e-Visit to address those concerns.  You will get an e-mail in the next two days asking about your experience.  I hope that your e-visit has been valuable and will speed your recovery. Thank you for using e-visits.

## 2017-06-21 ENCOUNTER — Ambulatory Visit (INDEPENDENT_AMBULATORY_CARE_PROVIDER_SITE_OTHER): Payer: 59 | Admitting: Family

## 2017-06-21 ENCOUNTER — Encounter: Payer: Self-pay | Admitting: Family

## 2017-06-21 VITALS — BP 118/69 | HR 72 | Temp 98.0°F | Resp 18 | Ht 66.0 in | Wt 208.4 lb

## 2017-06-21 DIAGNOSIS — Z23 Encounter for immunization: Secondary | ICD-10-CM

## 2017-06-21 DIAGNOSIS — Z0000 Encounter for general adult medical examination without abnormal findings: Secondary | ICD-10-CM

## 2017-06-21 LAB — URINALYSIS, ROUTINE W REFLEX MICROSCOPIC
Bilirubin Urine: NEGATIVE
HGB URINE DIPSTICK: NEGATIVE
Ketones, ur: NEGATIVE
Leukocytes, UA: NEGATIVE
NITRITE: NEGATIVE
RBC / HPF: NONE SEEN (ref 0–?)
SPECIFIC GRAVITY, URINE: 1.015 (ref 1.000–1.030)
Total Protein, Urine: NEGATIVE
Urine Glucose: NEGATIVE
Urobilinogen, UA: 0.2 (ref 0.0–1.0)
WBC UA: NONE SEEN (ref 0–?)
pH: 6 (ref 5.0–8.0)

## 2017-06-21 LAB — LIPID PANEL
CHOLESTEROL: 129 mg/dL (ref 0–200)
HDL: 42.2 mg/dL (ref >39.00)
LDL CALC: 66 mg/dL (ref 0–99)
NonHDL: 86.53
TRIGLYCERIDES: 104 mg/dL (ref 0.0–149.0)
Total CHOL/HDL Ratio: 3
VLDL: 20.8 mg/dL (ref 0.0–40.0)

## 2017-06-21 LAB — HEPATIC FUNCTION PANEL
ALT: 15 U/L (ref 0–35)
AST: 15 U/L (ref 0–37)
Albumin: 4.2 g/dL (ref 3.5–5.2)
Alkaline Phosphatase: 60 U/L (ref 39–117)
BILIRUBIN DIRECT: 0.1 mg/dL (ref 0.0–0.3)
BILIRUBIN TOTAL: 0.7 mg/dL (ref 0.2–1.2)
TOTAL PROTEIN: 7.6 g/dL (ref 6.0–8.3)

## 2017-06-21 LAB — CBC WITH DIFFERENTIAL/PLATELET
BASOS PCT: 0.6 % (ref 0.0–3.0)
Basophils Absolute: 0 10*3/uL (ref 0.0–0.1)
EOS ABS: 0 10*3/uL (ref 0.0–0.7)
Eosinophils Relative: 0.7 % (ref 0.0–5.0)
HCT: 39.4 % (ref 36.0–46.0)
HEMOGLOBIN: 12.9 g/dL (ref 12.0–15.0)
LYMPHS ABS: 1.8 10*3/uL (ref 0.7–4.0)
Lymphocytes Relative: 28.5 % (ref 12.0–46.0)
MCHC: 32.7 g/dL (ref 30.0–36.0)
MCV: 86.8 fl (ref 78.0–100.0)
MONO ABS: 0.5 10*3/uL (ref 0.1–1.0)
Monocytes Relative: 7.9 % (ref 3.0–12.0)
NEUTROS PCT: 62.3 % (ref 43.0–77.0)
Neutro Abs: 4 10*3/uL (ref 1.4–7.7)
Platelets: 151 10*3/uL (ref 150.0–400.0)
RBC: 4.54 Mil/uL (ref 3.87–5.11)
RDW: 12.7 % (ref 11.5–15.5)
WBC: 6.3 10*3/uL (ref 4.0–10.5)

## 2017-06-21 LAB — BASIC METABOLIC PANEL
BUN: 13 mg/dL (ref 6–23)
CO2: 28 mEq/L (ref 19–32)
CREATININE: 0.7 mg/dL (ref 0.40–1.20)
Calcium: 9.2 mg/dL (ref 8.4–10.5)
Chloride: 104 mEq/L (ref 96–112)
GFR: 102.98 mL/min (ref >60.00)
GLUCOSE: 86 mg/dL (ref 70–99)
Potassium: 4.3 mEq/L (ref 3.5–5.1)
Sodium: 138 mEq/L (ref 135–145)

## 2017-06-21 LAB — TSH: TSH: 0.62 u[IU]/mL (ref 0.35–4.50)

## 2017-06-21 NOTE — Addendum Note (Signed)
Addended by: Mervin KungFERGERSON, Rekia Kujala A on: 06/21/2017 05:27 PM   Modules accepted: Orders

## 2017-06-21 NOTE — Progress Notes (Signed)
Subjective:    Patient ID: Taylor Hicks, female    DOB: [birth year], 31 y.o.   MRN: [id number]  HPI  Patient presents today for complete physical.  Immunizations: flu shot today.  Tdap 7/15 Diet: fair diet Wt Readings from Last 3 Encounters:  06/21/17 208 lb 6.4 oz (94.5 kg)  01/25/17 209 lb 12 oz (95.1 kg)  08/01/16 200 lb 9.6 oz (91 kg)  Exercise: started [employer] 2-3 times a week Pap Smear: 10/15- will schedule follow up with GYN. Dental: up to date Vision: up to date  Reports sore throat- improving- got augmentin from e-visit at visit.     Review of Systems  Constitutional: Negative for unexpected weight change.  HENT: Negative for rhinorrhea.   Respiratory: Negative for cough.   Cardiovascular: Negative for leg swelling.  Gastrointestinal: Negative for blood in stool, constipation and diarrhea.  Genitourinary: Negative for dysuria, frequency and hematuria.  Musculoskeletal: Negative for arthralgias and myalgias.  Skin: Negative for rash.  Neurological: Negative for headaches.  Hematological: Negative for adenopathy.  Psychiatric/Behavioral:       Denies depression/anxiety       Past Medical History:  Diagnosis Date  . Allergy   . History of chicken pox      Social History   Social History  . Marital status: Married    Spouse name: N/A  . Number of children: N/A  . Years of education: N/A   Occupational History  . Not on file.   Social History Main Topics  . Smoking status: Never Smoker  . Smokeless tobacco: Never Used  . Alcohol use 0.0 oz/week     Comment: social   . Drug use: No  . Sexual activity: Yes    Birth control/ protection: Inserts   Other Topics Concern  . 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 CESAREAN SECTION N/A 05/05/2014   Procedure: CESAREAN SECTION;  Surgeon: Leslie Andrea, MD;  Location: [facility];  Service: Obstetrics;  Laterality: N/A;  . EYE SURGERY  1996  . EYE SURGERY      Family History  Problem Relation Age of Onset  . Diabetes Father   . Hypertension Father   . Hyperlipidemia Mother   . Diabetes Paternal Grandmother   . Diabetes Paternal Grandfather   . COPD Maternal Grandmother   . Osteoporosis Maternal Grandmother   . Cancer Maternal Grandfather        lung    Allergies  Allergen Reactions  . Benzoyl Peroxide Itching  . Azithromycin Hives    Current Outpatient Prescriptions on File Prior to Visit  Medication Sig Dispense Refill  . amoxicillin-clavulanate (AUGMENTIN) 875-125 MG tablet Take 1 tablet by mouth every 12 (twelve) hours. 14 tablet 0  . azelastine (ASTELIN) 0.1 % nasal spray Place 2 sprays into both nostrils 2 (two) times daily. Use in each nostril as directed 30 mL 3  . cetirizine (ZYRTEC) 10 MG tablet Take 10 mg by mouth daily.    . fluticasone (FLONASE) 50 MCG/ACT nasal spray Place 2 sprays into both nostrils daily. 16 g 1  . fluconazole (DIFLUCAN) 150 MG tablet Take 1 tablet by mouth at start of  yeast infection. May repeat in 3 days if symptoms are not resolved. (Patient not taking: Reported on 06/21/2017) 2 tablet 5   No current facility-administered medications on file prior to visit.     BP 118/69 (BP Location: Right Arm, Cuff Size: Large)   Pulse 72   Temp 98 F (36.7 C) (Oral)   Resp 18   Ht 5\' 6"  (1.676 m)   Wt 208 lb 6.4 oz (94.5 kg)   LMP 06/02/2017   SpO2 99%   BMI 33.64 kg/m    Objective:   Physical Exam  Physical Exam  Constitutional: She is oriented to person, place, and time. She appears well-developed and well-nourished. No distress.  HENT:  Head: Normocephalic and atraumatic.  Right Ear: Tympanic membrane and ear canal normal.  Left Ear: Tympanic membrane and ear  canal normal.  Mouth/Throat: Oropharynx is clear and moist.  Eyes: Pupils are equal, round, and reactive to light. No scleral icterus.  Neck: Normal range of motion. No thyromegaly present.  Cardiovascular: Normal rate and regular rhythm.   No murmur heard. Pulmonary/Chest: Effort normal and breath sounds normal. No respiratory distress. He has no wheezes. She has no rales. She exhibits no tenderness.  Abdominal: Soft. Bowel sounds are normal. She exhibits no distension and no mass. There is no tenderness. There is no rebound and no guarding.  Musculoskeletal: She exhibits no edema.  Lymphadenopathy:    She has no cervical adenopathy.  Neurological: She is alert and oriented to person, place, and time. She has normal patellar reflexes. She exhibits normal muscle tone. Coordination normal.  Skin: Skin is warm and dry.  Psychiatric: She has a normal mood and affect. Her behavior is normal. Judgment and thought content normal.  Breasts: Examined lying Right: Without masses, retractions, discharge or axillary adenopathy.  Left: Without masses, retractions, discharge or axillary adenopathy.  Pelvic: deferred to GYN           Assessment & Plan:         Assessment & Plan:  Preventative care- discussed health diet, exercise, weight loss. Immunizations reviewed and up to date- flu shot today.

## 2017-06-21 NOTE — Patient Instructions (Signed)
Continue the good work with healthy diet, regular exercise and weight loss.

## 2017-06-23 ENCOUNTER — Encounter: Payer: Self-pay | Admitting: Family

## 2017-06-23 MED ORDER — AZELASTINE HCL 0.1 % NA SOLN: 2.0000 | Freq: Two times a day (BID) | NASAL | 3 refills | Status: DC

## 2017-08-27 ENCOUNTER — Telehealth: Payer: 59 | Admitting: Family

## 2017-08-27 DIAGNOSIS — J019 Acute sinusitis, unspecified: Secondary | ICD-10-CM

## 2017-08-27 MED ORDER — AMOXICILLIN-POT CLAVULANATE 875-125 MG PO TABS: 1.0000 | ORAL_TABLET | Freq: Two times a day (BID) | ORAL | 0 refills | Status: DC

## 2017-08-27 NOTE — Progress Notes (Signed)

## 2017-09-13 DIAGNOSIS — R1909 Other intra-abdominal and pelvic swelling, mass and lump: Secondary | ICD-10-CM | POA: Diagnosis not present

## 2017-09-13 DIAGNOSIS — R102 Pelvic and perineal pain: Secondary | ICD-10-CM | POA: Diagnosis not present

## 2017-09-26 ENCOUNTER — Telehealth: Payer: Self-pay

## 2017-09-26 ENCOUNTER — Ambulatory Visit: Payer: 59 | Admitting: Family

## 2017-09-26 ENCOUNTER — Encounter: Payer: Self-pay | Admitting: Family

## 2017-09-26 VITALS — BP 122/75 | HR 71 | Temp 98.0°F | Resp 16 | Ht 66.0 in | Wt 205.0 lb

## 2017-09-26 DIAGNOSIS — B9789 Other viral agents as the cause of diseases classified elsewhere: Secondary | ICD-10-CM

## 2017-09-26 DIAGNOSIS — J069 Acute upper respiratory infection, unspecified: Secondary | ICD-10-CM | POA: Diagnosis not present

## 2017-09-26 NOTE — Patient Instructions (Signed)
Call if symptoms worsen or if not improved in 3 days.    Viral Respiratory Infection A viral respiratory infection is an illness that affects parts of the body used for breathing, like the lungs, nose, and throat. It is caused by a germ called a virus. Some examples of this kind of infection are:  A cold.  The flu (influenza).  A respiratory syncytial virus (RSV) infection.  How do I know if I have this infection? Most of the time this infection causes:  A stuffy or runny nose.  Yellow or green fluid in the nose.  A cough.  Sneezing.  Tiredness (fatigue).  Achy muscles.  A sore throat.  Sweating or chills.  A fever.  A headache.  How is this infection treated? If the flu is diagnosed early, it may be treated with an antiviral medicine. This medicine shortens the length of time a person has symptoms. Symptoms may be treated with over-the-counter and prescription medicines, such as:  Expectorants. These make it easier to cough up mucus.  Decongestant nasal sprays.  Doctors do not prescribe antibiotic medicines for viral infections. They do not work with this kind of infection. How do I know if I should stay home? To keep others from getting sick, stay home if you have:  A fever.  A lasting cough.  A sore throat.  A runny nose.  Sneezing.  Muscles aches.  Headaches.  Tiredness.  Weakness.  Chills.  Sweating.  An upset stomach (nausea).  Follow these instructions at home:  Rest as much as possible.  Take over-the-counter and prescription medicines only as told by your doctor.  Drink enough fluid to keep your pee (urine) clear or pale yellow.  Gargle with salt water. Do this 3-4 times per day or as needed. To make a salt-water mixture, dissolve -1 tsp of salt in 1 cup of warm water. Make sure the salt dissolves all the way.  Use nose drops made from salt water. This helps with stuffiness (congestion). It also helps soften the skin around  your nose.  Do not drink alcohol.  Do not use tobacco products, including cigarettes, chewing tobacco, and e-cigarettes. If you need help quitting, ask your doctor. Get help if:  Your symptoms last for 10 days or longer.  Your symptoms get worse over time.  You have a fever.  You have very bad pain in your face or forehead.  Parts of your jaw or neck become very swollen. Get help right away if:  You feel pain or pressure in your chest.  You have shortness of breath.  You faint or feel like you will faint.  You keep throwing up (vomiting).  You feel confused. This information is not intended to replace advice given to you by your health care provider. Make sure you discuss any questions you have with your health care provider. Document Released: 07/21/2008 Document Revised: 01/14/2016 Document Reviewed: 01/14/2015 Elsevier Interactive Patient Education  2018 [employer]

## 2017-09-26 NOTE — Telephone Encounter (Signed)
Ok with me 

## 2017-09-26 NOTE — Progress Notes (Signed)
Subjective:    Patient ID: Taylor Hicks, female    DOB: 03-07-1986, 32 y.o.   MRN: [id number]  HPI   Pt reports that she did evisit in early January.  Was treated with augmentin and then felt better for a few weeks. Last Wednesday developed ear congestion/sinus HA. Began mucinex. Friday she developed cough due to the post nasal drip.  Using zicam, coldeez, mucinex, nasal spray and zyrtec. Reports that she went to an [place] game on Saturday and just felt drained afterwords.  Reports ibuprofen helps some with her symptos.     Review of Systems    see HPI  Past Medical History:  Diagnosis Date  . Allergy   . History of chicken pox      Social History   Socioeconomic History  . Marital status: Married    Spouse name: Not on file  . Number of children: Not on file  . Years of education: Not on file  . Highest education level: Not on file  Social Needs  . Financial resource strain: Not on file  . Food insecurity - worry: Not on file  . Food insecurity - inability: Not on file  . Transportation needs - medical: Not on file  . Transportation needs - non-medical: Not on file  Occupational History  . Not on file  Tobacco Use  . Smoking status: Never Smoker  . Smokeless tobacco: Never Used  Substance and Sexual Activity  . Alcohol use: Yes    Alcohol/week: 0.0 oz    Comment: social   . Drug use: No  . Sexual activity: Yes    Birth control/protection: Inserts  Other Topics Concern  . 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 CESAREAN SECTION N/A 05/05/2014   Procedure: CESAREAN SECTION;  Surgeon: Leslie Andrea, MD;  Location: [facility];  Service: Obstetrics;  Laterality: N/A;  . EYE SURGERY  1996  . EYE  SURGERY      Family History  Problem Relation Age of Onset  . Diabetes Father   . Hypertension Father   . Hyperlipidemia Mother   . Diabetes Paternal Grandmother   . Diabetes Paternal Grandfather   . COPD Maternal Grandmother   . Osteoporosis Maternal Grandmother   . Cancer Maternal Grandfather        lung    Allergies  Allergen Reactions  . Benzoyl Peroxide Itching  . Azithromycin Hives    Current Outpatient Medications on File Prior to Visit  Medication Sig Dispense Refill  . azelastine (ASTELIN) 0.1 % nasal spray Place 2 sprays into both nostrils 2 (two) times daily. Use in each nostril as directed 30 mL 3  . cetirizine (ZYRTEC) 10 MG tablet Take 10 mg by mouth daily.    . fluconazole (DIFLUCAN) 150 MG tablet Take 1 tablet by mouth at start of yeast infection. May repeat in 3 days if symptoms are not resolved. 2 tablet 5  . fluticasone (FLONASE) 50 MCG/ACT nasal spray Place 2 sprays into both nostrils daily. 16 g 1   No current facility-administered medications on file prior to visit.     BP 122/75 (BP Location: Left Arm, Patient Position: Sitting, Cuff Size:  Large)   Pulse 71   Temp 98 F (36.7 C) (Oral)   Resp 16   Ht 5\' 6"  (1.676 m)   Wt 205 lb (93 kg)   SpO2 99%   BMI 33.09 kg/m    Objective:   Physical Exam  Constitutional: She is oriented to person, place, and time. She appears well-developed and well-nourished.  HENT:  Head: Normocephalic and atraumatic.  Right Ear: Tympanic membrane and ear canal normal.  Left Ear: Tympanic membrane normal.  Mouth/Throat: No oropharyngeal exudate or posterior oropharyngeal edema.  Cardiovascular: Normal rate, regular rhythm and normal heart sounds.  No murmur heard. Pulmonary/Chest: Effort normal and breath sounds normal. No respiratory distress. She has no wheezes.  Musculoskeletal: She exhibits no edema.  Neurological: She is alert and oriented to person, place, and time.  Psychiatric: She has a normal mood and  affect. Her behavior is normal. Judgment and thought content normal.          Assessment & Plan:  Viral URI with cough- advised pt to continue current supportive measures, call if symptoms worsen or if not improved in 3 days.

## 2017-09-26 NOTE — Telephone Encounter (Signed)
Copied from CRM [phone]. Topic: Inquiry >> Sep 26, 2017  3:29 PM Windy KalataMichael, Taylor L, NT wrote: Patient is currently with [facility] CrazeMel[facility] O'sullivan and would like to switch to Dr. Dayton MartesAron since this new office is right around the corner at her house. Please advise and contact patient if this transfer is possible.

## 2017-09-26 NOTE — Telephone Encounter (Signed)
Please advise 

## 2017-09-26 NOTE — Telephone Encounter (Signed)
Okay with me 

## 2017-09-27 NOTE — Telephone Encounter (Signed)
I called and spoke patient. Patient aware transfer has been approved. Patient scheduled appointment with Dr. Dayton MartesAron.

## 2017-10-16 ENCOUNTER — Encounter: Payer: Self-pay | Admitting: Family Medicine

## 2017-10-17 DIAGNOSIS — Z6833 Body mass index (BMI) 33.0-33.9, adult: Secondary | ICD-10-CM | POA: Diagnosis not present

## 2017-10-17 DIAGNOSIS — R1907 Generalized intra-abdominal and pelvic swelling, mass and lump: Secondary | ICD-10-CM | POA: Diagnosis not present

## 2017-10-17 DIAGNOSIS — Z01419 Encounter for gynecological examination (general) (routine) without abnormal findings: Secondary | ICD-10-CM | POA: Diagnosis not present

## 2017-10-17 MED ORDER — OSELTAMIVIR PHOSPHATE 75 MG PO CAPS: 75.0000 mg | ORAL_CAPSULE | Freq: Every day | ORAL | 0 refills | Status: DC

## 2017-10-17 NOTE — Patient Instructions (Addendum)
Your procedure is scheduled on: Wednesday October 25, 2017 at 7:30 am  Enter through the Main Entrance of [facility] at: 6:00 am  Pick up the phone at the desk and dial [phone].  Call this number if you have problems the morning of surgery: [phone].  Remember: Do NOT eat food or drink any liquids after: Midnight on Tuesday March 5  Take these medicines the morning of surgery with a SIP OF WATER: ZYRTEC, DIFLUCAN IF NEEDED. FLONASE SPRAY, AZELASTINE SPRAY, SYSTME EYE DROP IF NEEDED  Stop all herbal and supplements 1 week prior to surgery  Do not smoke day of surgery  Do NOT wear jewelry (body piercing), metal hair clips/bobby pins, make-up, or nail polish. Do NOT wear lotions, powders, or perfumes.  You may wear deoderant. Do NOT shave for 48 hours prior to surgery. Do NOT bring valuables to the hospital. Contacts, dentures, or bridgework may not be worn into surgery.  Have a responsible adult drive you home and stay with you for 24 hours after your procedure

## 2017-10-17 NOTE — Telephone Encounter (Signed)
FYI- Dr. Dayton MartesAron, Rx has been sent.

## 2017-10-18 ENCOUNTER — Encounter (HOSPITAL_COMMUNITY)
Admission: RE | Admit: 2017-10-18 | Discharge: 2017-10-18 | Disposition: A | Discharge status: Home / Self Care | Payer: 59 | Source: Ambulatory Visit | Attending: Obstetrics & Gynecology | Admitting: Obstetrics & Gynecology

## 2017-10-18 ENCOUNTER — Other Ambulatory Visit: Payer: Self-pay

## 2017-10-18 ENCOUNTER — Encounter (HOSPITAL_COMMUNITY): Payer: Self-pay

## 2017-10-18 DIAGNOSIS — Z01812 Encounter for preprocedural laboratory examination: Secondary | ICD-10-CM | POA: Diagnosis present

## 2017-10-18 HISTORY — DX: Headache, unspecified: R51.9

## 2017-10-18 HISTORY — DX: Headache: R51

## 2017-10-18 HISTORY — DX: Family history of other specified conditions: Z84.89

## 2017-10-18 HISTORY — DX: Chronic kidney disease, unspecified: N18.9

## 2017-10-18 LAB — CBC
HEMATOCRIT: 40.9 % (ref 36.0–46.0)
Hemoglobin: 13.3 g/dL (ref 12.0–15.0)
MCH: 28.1 pg (ref 26.0–34.0)
MCHC: 32.5 g/dL (ref 30.0–36.0)
MCV: 86.3 fL (ref 78.0–100.0)
PLATELETS: 148 10*3/uL — AB (ref 150–400)
RBC: 4.74 MIL/uL (ref 3.87–5.11)
RDW: 12.3 % (ref 11.5–15.5)
WBC: 6.1 10*3/uL (ref 4.0–10.5)

## 2017-10-18 LAB — TYPE AND SCREEN
ABO/RH(D): O POS
Antibody Screen: NEGATIVE

## 2017-10-22 NOTE — H&P (Signed)
Taylor Hicks is an 32 y.o. female with RLQ superficial abdominal pain; worsening over several months.  Patient reports pain is most intense during her period but is starting to be present all the time.  Only abdominal surgery is C/S 2015.  On physical exam, a palpable mass is present underlying right end of Pfannenstiel; superfascial and tender.   Pertinent Gynecological History: Menses: flow is moderate Bleeding: regular Contraception: abstinence DES exposure: unknown Blood transfusions: none Sexually transmitted diseases: no past history Previous GYN Procedures: C/S  Last mammogram: n/a Date: n/a Last pap: normal Date: 09/2017 OB History: G1, P1  Menstrual History: Menarche age: n/a No LMP recorded.    Past Medical History:  Diagnosis Date  . Allergy   . Chronic kidney disease 2016   UTI TURNED INTO KIDNEY INFECTION  . Family history of adverse reaction to anesthesia    GRANDMOTHER HAD HARD TIME WAKING UP FROM ANESTHESIA  . Headache    OCCAS MIGRAINES  . History of chicken pox     Past Surgical History:  Procedure Laterality Date  . CESAREAN SECTION N/A 05/05/2014   Procedure: CESAREAN SECTION;  Surgeon: Leslie AndreaJames E Tomblin II, MD;  Location: [facility];  Service: Obstetrics;  Laterality: N/A;  . EYE SURGERY  1996  . EYE SURGERY      Family History  Problem Relation Age of Onset  . Diabetes Father   . Hypertension Father   . Hyperlipidemia Mother   . Diabetes Paternal Grandmother   . Diabetes Paternal Grandfather   . COPD Maternal Grandmother   . Osteoporosis Maternal Grandmother   . Cancer Maternal Grandfather        lung    Social History:  reports that  has never smoked. she has never used smokeless tobacco. She reports that she drinks alcohol. She reports that she does not use drugs.  Allergies:  Allergies  Allergen Reactions  . Benzoyl Peroxide Itching  . Azithromycin Hives    No medications prior to admission.    ROS  There were no vitals taken for  this visit. Physical Exam  Constitutional: She is oriented to person, place, and time. She appears well-developed and well-nourished.  GI: Soft. There is no rebound.  Neurological: She is alert and oriented to person, place, and time.  Skin: Skin is warm and dry.    No results found for this or any previous visit (from the past 24 hour(s)).  No results found.  Assessment/Plan: 32yo G1P1 with right sided incision mass -Removal of incisional mass -Patient is counseled re: risk of bleeding, infection, scarring, and damage to surrounding structures.  She understands the possibility of fascial involvement.  All questions were answered and the patient wishes to proceed.  Taylor Hicks 10/22/2017, 8:42 PM

## 2017-10-24 NOTE — Anesthesia Preprocedure Evaluation (Addendum)
Anesthesia Evaluation  Patient identified by MRN, date of birth, ID band Patient awake    Reviewed: Allergy & Precautions, NPO status , Patient's Chart, lab work & pertinent test results  Airway Mallampati: I       Dental no notable dental hx. (+) Teeth Intact   Pulmonary    Pulmonary exam normal breath sounds clear to auscultation       Cardiovascular negative cardio ROS Normal cardiovascular exam Rhythm:Regular Rate:Normal     Neuro/Psych negative psych ROS   GI/Hepatic   Endo/Other  negative endocrine ROS  Renal/GU   negative genitourinary   Musculoskeletal negative musculoskeletal ROS (+)   Abdominal (+) + obese,   Peds  Hematology negative hematology ROS (+)   Anesthesia Other Findings   Reproductive/Obstetrics negative OB ROS                            Anesthesia Physical Anesthesia Plan  ASA: II  Anesthesia Plan: General   Post-op Pain Management:    Induction:   PONV Risk Score and Plan: 3 and Ondansetron, Dexamethasone and Scopolamine patch - Pre-op  Airway Management Planned: LMA  Additional Equipment:   Intra-op Plan:   Post-operative Plan:   Informed Consent: I have reviewed the patients History and Physical, chart, labs and discussed the procedure including the risks, benefits and alternatives for the proposed anesthesia with the patient or authorized representative who has indicated his/her understanding and acceptance.     Plan Discussed with: CRNA and Surgeon  Anesthesia Plan Comments:        Anesthesia Quick Evaluation

## 2017-10-25 ENCOUNTER — Ambulatory Visit (HOSPITAL_COMMUNITY): Payer: 59 | Admitting: Anesthesiology

## 2017-10-25 ENCOUNTER — Encounter (HOSPITAL_COMMUNITY)
Admission: AD | Disposition: A | Discharge status: Home / Self Care | Payer: Self-pay | Source: Ambulatory Visit | Attending: Obstetrics & Gynecology

## 2017-10-25 ENCOUNTER — Encounter (HOSPITAL_COMMUNITY): Payer: Self-pay

## 2017-10-25 ENCOUNTER — Ambulatory Visit (HOSPITAL_COMMUNITY)
Admission: AD | Admit: 2017-10-25 | Discharge: 2017-10-25 | Disposition: A | Discharge status: Home / Self Care | Payer: 59 | Source: Ambulatory Visit | Attending: Obstetrics & Gynecology | Admitting: Obstetrics & Gynecology

## 2017-10-25 DIAGNOSIS — G43909 Migraine, unspecified, not intractable, without status migrainosus: Secondary | ICD-10-CM | POA: Insufficient documentation

## 2017-10-25 DIAGNOSIS — Z881 Allergy status to other antibiotic agents status: Secondary | ICD-10-CM | POA: Diagnosis not present

## 2017-10-25 DIAGNOSIS — Z6832 Body mass index (BMI) 32.0-32.9, adult: Secondary | ICD-10-CM | POA: Insufficient documentation

## 2017-10-25 DIAGNOSIS — Z825 Family history of asthma and other chronic lower respiratory diseases: Secondary | ICD-10-CM | POA: Diagnosis not present

## 2017-10-25 DIAGNOSIS — Z801 Family history of malignant neoplasm of trachea, bronchus and lung: Secondary | ICD-10-CM | POA: Insufficient documentation

## 2017-10-25 DIAGNOSIS — E669 Obesity, unspecified: Secondary | ICD-10-CM | POA: Insufficient documentation

## 2017-10-25 DIAGNOSIS — Z833 Family history of diabetes mellitus: Secondary | ICD-10-CM | POA: Diagnosis not present

## 2017-10-25 DIAGNOSIS — Z888 Allergy status to other drugs, medicaments and biological substances status: Secondary | ICD-10-CM | POA: Diagnosis not present

## 2017-10-25 DIAGNOSIS — R1907 Generalized intra-abdominal and pelvic swelling, mass and lump: Secondary | ICD-10-CM | POA: Diagnosis not present

## 2017-10-25 DIAGNOSIS — N189 Chronic kidney disease, unspecified: Secondary | ICD-10-CM | POA: Diagnosis not present

## 2017-10-25 DIAGNOSIS — Z8262 Family history of osteoporosis: Secondary | ICD-10-CM | POA: Insufficient documentation

## 2017-10-25 DIAGNOSIS — N808 Other endometriosis: Secondary | ICD-10-CM | POA: Diagnosis not present

## 2017-10-25 DIAGNOSIS — N809 Endometriosis, unspecified: Secondary | ICD-10-CM | POA: Diagnosis not present

## 2017-10-25 DIAGNOSIS — R229 Localized swelling, mass and lump, unspecified: Secondary | ICD-10-CM | POA: Diagnosis present

## 2017-10-25 DIAGNOSIS — Z8249 Family history of ischemic heart disease and other diseases of the circulatory system: Secondary | ICD-10-CM | POA: Diagnosis not present

## 2017-10-25 DIAGNOSIS — R222 Localized swelling, mass and lump, trunk: Secondary | ICD-10-CM | POA: Diagnosis not present

## 2017-10-25 HISTORY — PX: WOUND EXPLORATION: SHX6188

## 2017-10-25 LAB — PREGNANCY, URINE: PREG TEST UR: NEGATIVE

## 2017-10-25 SURGERY — WOUND EXPLORATION
Anesthesia: General | Site: Abdomen

## 2017-10-25 MED ORDER — FENTANYL CITRATE (PF) 100 MCG/2ML IJ SOLN
INTRAMUSCULAR | Status: DC | PRN
  Administered 2017-10-25 (×2): 25 ug via INTRAVENOUS
  Administered 2017-10-25: 50 ug via INTRAVENOUS

## 2017-10-25 MED ORDER — MIDAZOLAM HCL 2 MG/2ML IJ SOLN
INTRAMUSCULAR | Status: DC | PRN
  Administered 2017-10-25: 2 mg via INTRAVENOUS

## 2017-10-25 MED ORDER — FENTANYL CITRATE (PF) 100 MCG/2ML IJ SOLN
INTRAMUSCULAR | Status: AC
  Filled 2017-10-25: qty 2

## 2017-10-25 MED ORDER — PROPOFOL 10 MG/ML IV BOLUS
INTRAVENOUS | Status: DC | PRN
  Administered 2017-10-25: 150 mg via INTRAVENOUS

## 2017-10-25 MED ORDER — OXYCODONE HCL 5 MG/5ML PO SOLN: 5.0000 mg | Freq: Once | ORAL | Status: AC | PRN

## 2017-10-25 MED ORDER — ONDANSETRON HCL 4 MG/2ML IJ SOLN
INTRAMUSCULAR | Status: DC | PRN
  Administered 2017-10-25: 4 mg via INTRAVENOUS

## 2017-10-25 MED ORDER — MIDAZOLAM HCL 2 MG/2ML IJ SOLN
INTRAMUSCULAR | Status: AC
  Filled 2017-10-25: qty 2

## 2017-10-25 MED ORDER — ONDANSETRON HCL 4 MG/2ML IJ SOLN
INTRAMUSCULAR | Status: AC
  Filled 2017-10-25: qty 2

## 2017-10-25 MED ORDER — IBUPROFEN 800 MG PO TABS: 800.0000 mg | ORAL_TABLET | Freq: Four times a day (QID) | ORAL | 1 refills | Status: DC | PRN

## 2017-10-25 MED ORDER — DEXAMETHASONE SODIUM PHOSPHATE 4 MG/ML IJ SOLN
INTRAMUSCULAR | Status: AC
  Filled 2017-10-25: qty 1

## 2017-10-25 MED ORDER — LACTATED RINGERS IV SOLN: INTRAVENOUS | Status: DC

## 2017-10-25 MED ORDER — ACETAMINOPHEN 325 MG PO TABS
ORAL_TABLET | ORAL | Status: AC
  Filled 2017-10-25: qty 2

## 2017-10-25 MED ORDER — SCOPOLAMINE 1 MG/3DAYS TD PT72
MEDICATED_PATCH | TRANSDERMAL | Status: AC
  Administered 2017-10-25: 1.5 mg via TRANSDERMAL
  Filled 2017-10-25: qty 1

## 2017-10-25 MED ORDER — FENTANYL CITRATE (PF) 100 MCG/2ML IJ SOLN
INTRAMUSCULAR | Status: AC
  Administered 2017-10-25: 50 ug via INTRAVENOUS
  Filled 2017-10-25: qty 2

## 2017-10-25 MED ORDER — CEFAZOLIN SODIUM-DEXTROSE 2-4 GM/100ML-% IV SOLN
2.0000 g | INTRAVENOUS | Status: AC
  Administered 2017-10-25: 2 g via INTRAVENOUS

## 2017-10-25 MED ORDER — LIDOCAINE HCL (CARDIAC) 20 MG/ML IV SOLN
INTRAVENOUS | Status: AC
  Filled 2017-10-25: qty 5

## 2017-10-25 MED ORDER — OXYCODONE HCL 5 MG PO TABS
5.0000 mg | ORAL_TABLET | Freq: Once | ORAL | Status: AC | PRN
  Administered 2017-10-25: 5 mg via ORAL

## 2017-10-25 MED ORDER — MEPERIDINE HCL 25 MG/ML IJ SOLN: 6.2500 mg | INTRAMUSCULAR | Status: DC | PRN

## 2017-10-25 MED ORDER — LACTATED RINGERS IV SOLN
INTRAVENOUS | Status: DC
  Administered 2017-10-25 (×2): via INTRAVENOUS

## 2017-10-25 MED ORDER — OXYCODONE-ACETAMINOPHEN 2.5-325 MG PO TABS: 1.0000 | ORAL_TABLET | Freq: Four times a day (QID) | ORAL | 0 refills | Status: DC | PRN

## 2017-10-25 MED ORDER — KETOROLAC TROMETHAMINE 30 MG/ML IJ SOLN
INTRAMUSCULAR | Status: AC
  Filled 2017-10-25: qty 1

## 2017-10-25 MED ORDER — KETOROLAC TROMETHAMINE 30 MG/ML IJ SOLN
INTRAMUSCULAR | Status: DC | PRN
  Administered 2017-10-25: 30 mg via INTRAVENOUS

## 2017-10-25 MED ORDER — KETOROLAC TROMETHAMINE 30 MG/ML IJ SOLN: 30.0000 mg | Freq: Once | INTRAMUSCULAR | Status: DC | PRN

## 2017-10-25 MED ORDER — LIDOCAINE HCL 1 % IJ SOLN
INTRAMUSCULAR | Status: AC
  Filled 2017-10-25: qty 20

## 2017-10-25 MED ORDER — ACETAMINOPHEN 160 MG/5ML PO SOLN: 325.0000 mg | ORAL | Status: DC | PRN

## 2017-10-25 MED ORDER — LIDOCAINE HCL (CARDIAC) 20 MG/ML IV SOLN
INTRAVENOUS | Status: DC | PRN
  Administered 2017-10-25: 100 mg via INTRAVENOUS

## 2017-10-25 MED ORDER — OXYCODONE HCL 5 MG PO TABS
ORAL_TABLET | ORAL | Status: AC
  Filled 2017-10-25: qty 1

## 2017-10-25 MED ORDER — FENTANYL CITRATE (PF) 100 MCG/2ML IJ SOLN
25.0000 ug | INTRAMUSCULAR | Status: DC | PRN
  Administered 2017-10-25 (×3): 50 ug via INTRAVENOUS

## 2017-10-25 MED ORDER — ONDANSETRON HCL 4 MG/2ML IJ SOLN: 4.0000 mg | Freq: Once | INTRAMUSCULAR | Status: DC | PRN

## 2017-10-25 MED ORDER — 0.9 % SODIUM CHLORIDE (POUR BTL) OPTIME
TOPICAL | Status: DC | PRN
  Administered 2017-10-25: 1000 mL

## 2017-10-25 MED ORDER — CEFAZOLIN SODIUM-DEXTROSE 2-4 GM/100ML-% IV SOLN
INTRAVENOUS | Status: AC
  Filled 2017-10-25: qty 100

## 2017-10-25 MED ORDER — SCOPOLAMINE 1 MG/3DAYS TD PT72
1.0000 | MEDICATED_PATCH | Freq: Once | TRANSDERMAL | Status: DC
  Administered 2017-10-25: 1.5 mg via TRANSDERMAL

## 2017-10-25 MED ORDER — PROPOFOL 10 MG/ML IV BOLUS
INTRAVENOUS | Status: AC
  Filled 2017-10-25: qty 20

## 2017-10-25 MED ORDER — ACETAMINOPHEN 325 MG PO TABS
325.0000 mg | ORAL_TABLET | ORAL | Status: DC | PRN
  Administered 2017-10-25: 650 mg via ORAL

## 2017-10-25 MED ORDER — DEXAMETHASONE SODIUM PHOSPHATE 10 MG/ML IJ SOLN
INTRAMUSCULAR | Status: DC | PRN
  Administered 2017-10-25: 10 mg via INTRAVENOUS

## 2017-10-25 SURGICAL SUPPLY — 31 items
APL SKNCLS STERI-STRIP NONHPOA (GAUZE/BANDAGES/DRESSINGS) ×1
BENZOIN TINCTURE PRP APPL 2/3 (GAUZE/BANDAGES/DRESSINGS) ×3 IMPLANT
CANISTER SUCT 3000ML PPV (MISCELLANEOUS) ×3 IMPLANT
CLOSURE WOUND 1/4 X3 (GAUZE/BANDAGES/DRESSINGS)
CLOSURE WOUND 1/4X4 (GAUZE/BANDAGES/DRESSINGS) ×1
DECANTER SPIKE VIAL GLASS SM (MISCELLANEOUS) IMPLANT
DRSG OPSITE POSTOP 3X4 (GAUZE/BANDAGES/DRESSINGS) ×2 IMPLANT
DRSG OPSITE POSTOP 4X10 (GAUZE/BANDAGES/DRESSINGS) ×2 IMPLANT
DURAPREP 26ML APPLICATOR (WOUND CARE) ×2 IMPLANT
GAUZE SPONGE 4X4 16PLY XRAY LF (GAUZE/BANDAGES/DRESSINGS) IMPLANT
GLOVE BIOGEL PI IND STRL 7.0 (GLOVE) ×1 IMPLANT
GLOVE BIOGEL PI INDICATOR 7.0 (GLOVE) ×2
GOWN STRL REUS W/TWL LRG LVL3 (GOWN DISPOSABLE) ×6 IMPLANT
NEEDLE HYPO 22GX1.5 SAFETY (NEEDLE) IMPLANT
NS IRRIG 1000ML POUR BTL (IV SOLUTION) ×3 IMPLANT
PACK ABDOMINAL GYN (CUSTOM PROCEDURE TRAY) ×3 IMPLANT
PAD OB MATERNITY 4.3X12.25 (PERSONAL CARE ITEMS) ×3 IMPLANT
PROTECTOR NERVE ULNAR (MISCELLANEOUS) ×3 IMPLANT
SPONGE GAUZE 4X4 12PLY STER LF (GAUZE/BANDAGES/DRESSINGS) IMPLANT
SPONGE LAP 18X18 X RAY DECT (DISPOSABLE) IMPLANT
STAPLER VISISTAT 35W (STAPLE) IMPLANT
STRIP CLOSURE SKIN 1/4X3 (GAUZE/BANDAGES/DRESSINGS) ×1 IMPLANT
STRIP CLOSURE SKIN 1/4X4 (GAUZE/BANDAGES/DRESSINGS) ×1 IMPLANT
SUT PLAIN 2 0 (SUTURE) ×3
SUT PLAIN ABS 2-0 CT1 27XMFL (SUTURE) IMPLANT
SUT VIC AB 0 CT1 27 (SUTURE) ×3
SUT VIC AB 0 CT1 27XBRD ANBCTR (SUTURE) IMPLANT
SUT VIC AB 4-0 KS 27 (SUTURE) ×2 IMPLANT
SYR CONTROL 10ML LL (SYRINGE) IMPLANT
TOWEL OR 17X24 6PK STRL BLUE (TOWEL DISPOSABLE) ×6 IMPLANT
TRAY FOLEY CATH SILVER 14FR (SET/KITS/TRAYS/PACK) ×3 IMPLANT

## 2017-10-25 NOTE — Discharge Instructions (Signed)
Incision Care, Adult An incision is a surgical cut that is made through your skin. Most incisions are closed after surgery. Your incision may be closed with stitches (sutures), staples, skin glue, or adhesive strips. You may need to return to your health care provider to have sutures or staples removed. This may occur several days to several weeks after your surgery. The incision needs to be cared for properly to prevent infection. How to care for your incision Incision care   Follow instructions from your health care provider about how to take care of your incision. Make sure you: ? Wash your hands with soap and water before you change the bandage (dressing). If soap and water are not available, use hand sanitizer. ? Change your dressing as told by your health care provider. ? Leave sutures, skin glue, or adhesive strips in place. These skin closures may need to stay in place for 2 weeks or longer. If adhesive strip edges start to loosen and curl up, you may trim the loose edges. Do not remove adhesive strips completely unless your health care provider tells you to do that.  Check your incision area every day for signs of infection. Check for: ? More redness, swelling, or pain. ? More fluid or blood. ? Warmth. ? Pus or a bad smell.  Ask your health care provider how to clean the incision. This may include: ? Using mild soap and water. ? Using a clean towel to pat the incision dry after cleaning it. ? Applying a cream or ointment. Do this only as told by your health care provider. ? Covering the incision with a clean dressing.  Ask your health care provider when you can leave the incision uncovered.  Do not take baths, swim, or use a hot tub until your health care provider approves. Ask your health care provider if you can take showers. You may only be allowed to take sponge baths for bathing.  General instructions  Limit movement around your incision to improve healing. ? Avoid  straining, lifting, or exercise for the first month, or for as long as told by your health care provider. ? Follow instructions from your health care provider about returning to your normal activities. ? Ask your health care provider what activities are safe.  Protect your incision from the sun when you are outside for the first 6 months, or for as long as told by your health care provider. Apply sunscreen around the scar or cover it up.  Keep all follow-up visits as told by your health care provider. This is important.  Contact a health care provider if:  Your have more redness, swelling, or pain around the incision.  You have more fluid or blood coming from the incision.  Your incision feels warm to the touch.  You have pus or a bad smell coming from the incision.  You have a fever or shaking chills.  You are nauseous or you vomit.  You are dizzy.  Your sutures or staples come undone.  Get help right away if:  You have a red streak coming from your incision.  Your incision bleeds through the dressing and the bleeding does not stop with gentle pressure.  The edges of your incision open up and separate.  You have severe pain.  You have a rash.  You are confused.  You faint.  You have trouble breathing and a fast heartbeat.    Call MD for T>100.4, severe abdominal pain, or respiratory distress.  Call office  to schedule postop appointment in 2 weeks.  No driving while taking narcotics.

## 2017-10-25 NOTE — Transfer of Care (Signed)
Immediate Anesthesia Transfer of Care Note  Patient: Taylor Hicks  Procedure(s) Performed: REMOVAL OF INCISIONAL MASS (N/A Abdomen)  Patient Location: PACU  Anesthesia Type:General  Level of Consciousness: awake, alert  and oriented  Airway & Oxygen Therapy: Patient Spontanous Breathing and Patient connected to nasal cannula oxygen  Post-op Assessment: Report given to RN, Post -op Vital signs reviewed and stable and Patient moving all extremities  Post vital signs: Reviewed and stable  Last Vitals:  Vitals:   10/25/17 0616  BP: 124/90  Pulse: 71  Resp: 18  Temp: 36.6 C  SpO2: 98%    Last Pain:  Vitals:   10/25/17 0616  TempSrc: Oral      Patients Stated Pain Goal: 3 (10/25/17 0616)  Complications: No apparent anesthesia complications

## 2017-10-25 NOTE — Progress Notes (Signed)
No change to H&P.  Justus Duerr, DO 

## 2017-10-25 NOTE — Anesthesia Procedure Notes (Signed)
Procedure Name: LMA Insertion Date/Time: 10/25/2017 7:38 AM Performed by: Elgie CongoMalinova, Anola Mcgough H, CRNA Pre-anesthesia Checklist: Patient identified, Emergency Drugs available, Suction available and Patient being monitored Patient Re-evaluated:Patient Re-evaluated prior to induction Oxygen Delivery Method: Circle system utilized Preoxygenation: Pre-oxygenation with 100% oxygen Induction Type: IV induction Ventilation: Mask ventilation with difficulty LMA: LMA inserted LMA Size: 4.0 Placement Confirmation: positive ETCO2 and breath sounds checked- equal and bilateral Tube secured with: Tape Dental Injury: Teeth and Oropharynx as per pre-operative assessment

## 2017-10-25 NOTE — Op Note (Signed)
Taylor Hicks DOB 1985/11/18  PROCEDURE DATE: 10/25/2017  PREOPERATIVE DIAGNOSIS: Painful right sided incisional mass  POSTOPERATIVE DIAGNOSIS: The same  PROCEDURE:  Removal of incisional mass  SURGEON:  Dr. Mitchel HonourMegan Chana Lindstrom  INDICATIONS: Taylor Hicks is a 32 y.o. with an increasingly painful and palpable mass at the right aspect of her Pfannenstiel.  Patient desires surgical treatment.  The risks of surgery were discussed with the patient including but not limited to: bleeding which may require transfusion or reoperation; infection which may require antibiotics; injury to bowel, bladder, or other surrounding organs; need for additional procedures, incisional problems, thromboembolic phenomenon and other postoperative/anesthesia complications. The patient concurred with the proposed plan, giving informed written consent for the procedure.    FINDINGS: Supra-fascial fibrous density (~2 cm); well circumscribed .   ANESTHESIA: GETA ESTIMATED BLOOD LOSS: 15 mL ml SPECIMENS: Incisional mass COMPLICATIONS: None immediate  PROCEDURE IN DETAIL:  The patient received intravenous antibiotics and had sequential compression devices applied to her lower extremities while in the preoperative area.  She was then taken to the operating room where general anesthesia was administered.  A foley catheter was placed into her bladder and attached to constant gravity.  After an adequate timeout was performed, a skin incision was made with scalpel overlying the palpable right sided mass.  Using sharp and blunt dissection, a well circumscribed 2 cm density was dissected out. Surrounding thickened tissue additionally was removed using Bovie cautery.  The fascial layer was not interrupted during the excision. Hemostasis was observed.  The deep subcutaneous tissue was closed with 0 Vicryl in a running fashion.  The superficial subcutaneous tissue was reapproximated using plain gut suture in three interrupted stitches. The  skin was closed with 4-0 Vicryl in a subcuticular fashion.  Pt tolerated the procedure will.  All counts were correct x2.  Pt went to the recovery room in stable condition.

## 2017-10-25 NOTE — Anesthesia Postprocedure Evaluation (Signed)
Anesthesia Post Note  Patient: Taylor DanaKatie Hicks Taylor Hicks  Procedure(s) Performed: REMOVAL OF INCISIONAL MASS (N/A Abdomen)     Patient location during evaluation: PACU Anesthesia Type: General Level of consciousness: awake Pain management: pain level controlled Vital Signs Assessment: post-procedure vital signs reviewed and stable Respiratory status: spontaneous breathing Cardiovascular status: stable Postop Assessment: no apparent nausea or vomiting Anesthetic complications: no    Last Vitals:  Vitals:   10/25/17 0845 10/25/17 0900  BP: 109/70 106/64  Pulse: 64 (!) 53  Resp: 17 16  Temp:    SpO2: 99% 98%    Last Pain:  Vitals:   10/25/17 0900  TempSrc:   PainSc: 7    Pain Goal: Patients Stated Pain Goal: 3 (10/25/17 0616)               Kaydan Wilhoite JR,JOHN Susann GivensFRANKLIN

## 2017-10-26 ENCOUNTER — Encounter (HOSPITAL_COMMUNITY): Payer: Self-pay | Admitting: Obstetrics & Gynecology

## 2017-11-30 ENCOUNTER — Ambulatory Visit: Payer: 59 | Admitting: Family Medicine

## 2017-11-30 ENCOUNTER — Encounter: Payer: Self-pay | Admitting: Family Medicine

## 2017-11-30 VITALS — BP 126/70 | HR 84 | Temp 98.5°F | Ht 66.0 in | Wt 201.2 lb

## 2017-11-30 DIAGNOSIS — J301 Allergic rhinitis due to pollen: Secondary | ICD-10-CM

## 2017-11-30 DIAGNOSIS — J019 Acute sinusitis, unspecified: Secondary | ICD-10-CM

## 2017-11-30 DIAGNOSIS — H6983 Other specified disorders of Eustachian tube, bilateral: Secondary | ICD-10-CM | POA: Diagnosis not present

## 2017-11-30 MED ORDER — FLUTICASONE PROPIONATE 50 MCG/ACT NA SUSP: 2.0000 | Freq: Every day | NASAL | 1 refills | Status: DC

## 2017-11-30 MED ORDER — AMOXICILLIN 500 MG PO CAPS: 500.0000 mg | ORAL_CAPSULE | Freq: Three times a day (TID) | ORAL | 0 refills | Status: DC

## 2017-11-30 MED ORDER — AZELASTINE HCL 0.1 % NA SOLN: 2.0000 | Freq: Every day | NASAL | 2 refills | Status: DC

## 2017-11-30 NOTE — Progress Notes (Signed)
Oh   Subjective:  Patient ID: Taylor Hicks, female    DOB: December 14, 1985  Age: 32 y.o. MRN: [id number]  CC: Sore Throat   HPI Taylor Hicks presents for one-week history of a cough productive of copious yellow phlegm.  She has been having some stuffy nose and stuffy ears with postnasal drip.  There is been some pressure behind her eyes.  She denies sneezing, fevers chills, nausea or vomiting.  She has not been wheezing with this.  She has tried Mucinex and Zyrtec with some relief.  She does not smoke.  Patient vented with regards to her husband's relatively recent spinal cord injury.  He continues his recovery.  Outpatient Medications Prior to Visit  Medication Sig Dispense Refill  . cetirizine (ZYRTEC) 10 MG tablet Take 10 mg by mouth daily as needed for allergies.     Marland Kitchen ibuprofen (ADVIL,MOTRIN) 800 MG tablet Take 1 tablet (800 mg total) by mouth every 6 (six) hours as needed. 30 tablet 1  . Propylene Glycol (SYSTANE COMPLETE) 0.6 % SOLN Place 1 drop into both eyes 2 (two) times daily.    Marland Kitchen azelastine (ASTELIN) 0.1 % nasal spray Place 2 sprays into both nostrils 2 (two) times daily. Use in each nostril as directed (Patient taking differently: Place 2 sprays into both nostrils daily. Use in each nostril as directed) 30 mL 3  . fluticasone (FLONASE) 50 MCG/ACT nasal spray Place 2 sprays into both nostrils daily. 16 g 1  . fluconazole (DIFLUCAN) 150 MG tablet Take 1 tablet by mouth at start of yeast infection. May repeat in 3 days if symptoms are not resolved. (Patient not taking: Reported on 10/11/2017) 2 tablet 5  . oseltamivir (TAMIFLU) 75 MG capsule Take 1 capsule (75 mg total) by mouth daily. 10 capsule 0  . oxycodone-acetaminophen (PERCOCET) 2.5-325 MG tablet Take 1-2 tablets by mouth every 6 (six) hours as needed for pain. 30 tablet 0   No facility-administered medications prior to visit.     ROS Review of Systems  Constitutional: Negative for chills, fatigue, fever and unexpected weight  change.  HENT: Positive for congestion, hearing loss, postnasal drip, rhinorrhea, sinus pressure and sore throat. Negative for ear discharge, ear pain, sneezing and trouble swallowing.   Eyes: Negative for photophobia.  Respiratory: Positive for cough. Negative for shortness of breath and wheezing.   Cardiovascular: Negative.   Gastrointestinal: Negative.   Endocrine: Negative for polyphagia and polyuria.  Genitourinary: Negative.   Musculoskeletal: Negative for arthralgias and myalgias.  Skin: Negative for pallor and rash.  Neurological: Positive for headaches. Negative for weakness.  Hematological: Does not bruise/bleed easily.  Psychiatric/Behavioral: Negative.     Objective:  BP 126/70 (BP Location: Right Arm, Patient Position: Sitting, Cuff Size: Normal)   Pulse 84   Temp 98.5 F (36.9 C) (Oral)   Ht 5\' 6"  (1.676 m)   Wt 201 lb 4 oz (91.3 kg)   SpO2 98%   BMI 32.48 kg/m   BP Readings from Last 3 Encounters:  11/30/17 126/70  10/25/17 112/75  10/18/17 111/77    Wt Readings from Last 3 Encounters:  11/30/17 201 lb 4 oz (91.3 kg)  10/25/17 199 lb (90.3 kg)  09/26/17 205 lb (93 kg)    Physical Exam  Constitutional: She is oriented to person, place, and time. She appears well-developed and well-nourished.  Non-toxic appearance. She does not appear ill. No distress.  HENT:  Head: Normocephalic and atraumatic.  Right Ear: Hearing and ear canal normal.  No drainage, swelling or tenderness. Tympanic membrane is retracted. No middle ear effusion.  Left Ear: Hearing and ear canal normal. No drainage, swelling or tenderness. Tympanic membrane is retracted.  No middle ear effusion.  Mouth/Throat: Uvula is midline, oropharynx is clear and moist and mucous membranes are normal. Mucous membranes are not pale and not dry. No posterior oropharyngeal edema, posterior oropharyngeal erythema or tonsillar abscesses.  Eyes: Pupils are equal, round, and reactive to light.  Neck: Normal  range of motion. Neck supple. No thyromegaly present.  Cardiovascular: Normal rate, regular rhythm and normal heart sounds.  Pulmonary/Chest: Effort normal and breath sounds normal. No respiratory distress. She has no wheezes. She has no rhonchi. She has no rales.  Abdominal: Soft. Bowel sounds are normal.  Lymphadenopathy:    She has no cervical adenopathy.  Neurological: She is alert and oriented to person, place, and time.  Skin: Skin is warm and dry.  Psychiatric: She has a normal mood and affect. Her behavior is normal.    Lab Results  Component Value Date   WBC 6.1 10/18/2017   HGB 13.3 10/18/2017   HCT 40.9 10/18/2017   PLT 148 (L) 10/18/2017   GLUCOSE 86 06/21/2017   CHOL 129 06/21/2017   TRIG 104.0 06/21/2017   HDL 42.20 06/21/2017   LDLCALC 66 06/21/2017   ALT 15 06/21/2017   AST 15 06/21/2017   NA 138 06/21/2017   K 4.3 06/21/2017   CL 104 06/21/2017   CREATININE 0.70 06/21/2017   BUN 13 06/21/2017   CO2 28 06/21/2017   TSH 0.62 06/21/2017    No results found.  Assessment & Plan:   Taylor AddisonKatie was seen today for sore throat.  Diagnoses and all orders for this visit:  Dysfunction of both eustachian tubes -     fluticasone (FLONASE) 50 MCG/ACT nasal spray; Place 2 sprays into both nostrils daily. -     azelastine (ASTELIN) 0.1 % nasal spray; Place 2 sprays into both nostrils daily. Use in each nostril as directed  Acute sinusitis, recurrence not specified, unspecified location -     amoxicillin (AMOXIL) 500 MG capsule; Take 1 capsule (500 mg total) by mouth 3 (three) times daily.  Seasonal allergic rhinitis due to pollen -     fluticasone (FLONASE) 50 MCG/ACT nasal spray; Place 2 sprays into both nostrils daily. -     azelastine (ASTELIN) 0.1 % nasal spray; Place 2 sprays into both nostrils daily. Use in each nostril as directed   I have discontinued Taylor Hicks's fluconazole, oseltamivir, and oxycodone-acetaminophen. I have also changed her azelastine.  Additionally, I am having her start on amoxicillin. Lastly, I am having her maintain her cetirizine, Propylene Glycol, ibuprofen, and fluticasone.  Meds ordered this encounter  Medications  . fluticasone (FLONASE) 50 MCG/ACT nasal spray    Sig: Place 2 sprays into both nostrils daily.    Dispense:  16 g    Refill:  1    This prescription was filled on 03/25/2016. Any refills authorized will be placed on file.  Marland Kitchen. azelastine (ASTELIN) 0.1 % nasal spray    Sig: Place 2 sprays into both nostrils daily. Use in each nostril as directed    Dispense:  30 mL    Refill:  2  . amoxicillin (AMOXIL) 500 MG capsule    Sig: Take 1 capsule (500 mg total) by mouth 3 (three) times daily.    Dispense:  30 capsule    Refill:  0   Start Amoxil  today for 10 days.  Refilled her Flonase and Astelin.  Asked her to consider switching to Zyrtec D to help her ear congestion ingestion as well.  Follow-up: Return in about 1 week (around 12/07/2017), or if symptoms worsen or fail to improve.  Mliss Sax, MD

## 2018-02-13 ENCOUNTER — Ambulatory Visit: Payer: 59 | Admitting: Family Medicine

## 2018-02-13 ENCOUNTER — Encounter: Payer: Self-pay | Admitting: Family Medicine

## 2018-02-13 VITALS — BP 132/76 | HR 78 | Temp 98.7°F | Ht 66.0 in | Wt 205.0 lb

## 2018-02-13 DIAGNOSIS — H9202 Otalgia, left ear: Secondary | ICD-10-CM

## 2018-02-13 NOTE — Progress Notes (Signed)
Taylor Hicks - 32 y.o. female MRN [id number]  Date of birth: [birth year]  SUBJECTIVE:  Including CC & ROS.  Chief Complaint  Patient presents with  . Ear Pain    Taylor Hicks is a 32 y.o. female that is presenting with ear pain and sinus pressure. Ongoing for one week. Admits to drainage. She has been taking Mucinex-D with no improvement. Denies fever and body aches. Her son was sick last week.     Review of Systems  Constitutional: Negative for fever.  HENT: Positive for congestion and ear pain.   Respiratory: Positive for cough.   Cardiovascular: Negative for chest pain.  Gastrointestinal: Negative for abdominal pain.    HISTORY: Past Medical, Surgical, Social, and Family History Reviewed & Updated per EMR.   Pertinent Historical Findings include:  Past Medical History:  Diagnosis Date  . Allergy   . Chronic kidney disease 2016   UTI TURNED INTO KIDNEY INFECTION  . Family history of adverse reaction to anesthesia    GRANDMOTHER HAD HARD TIME WAKING UP FROM ANESTHESIA  . Headache    OCCAS MIGRAINES  . History of chicken pox     Past Surgical History:  Procedure Laterality Date  . CESAREAN SECTION N/A 05/05/2014   Procedure: CESAREAN SECTION;  Surgeon: Leslie Andrea, MD;  Location: [facility];  Service: Obstetrics;  Laterality: N/A;  . EYE SURGERY  1996  . EYE SURGERY    . WOUND EXPLORATION N/A 10/25/2017   Procedure: REMOVAL OF INCISIONAL MASS;  Surgeon: Mitchel Honour, DO;  Location: [facility];  Service: Gynecology;  Laterality: N/A;    Allergies  Allergen Reactions  . Benzoyl Peroxide Itching  . Azithromycin Hives    Family History  Problem Relation Age of Onset  . Diabetes Father   . Hypertension Father   . Hyperlipidemia Mother   . Diabetes Paternal Grandmother   . Diabetes Paternal Grandfather   . COPD Maternal Grandmother   . Osteoporosis Maternal Grandmother   . Cancer Maternal Grandfather        lung     Social History   Socioeconomic History  .  Marital status: Married    Spouse name: Not on file  . Number of children: Not on file  . Years of education: Not on file  . Highest education level: Not on file  Occupational History  . Not on file  Social Needs  . Financial resource strain: Not on file  . Food insecurity:    Worry: Not on file    Inability: Not on file  . Transportation needs:    Medical: Not on file    Non-medical: Not on file  Tobacco Use  . Smoking status: Never Smoker  . Smokeless tobacco: Never Used  Substance and Sexual Activity  . Alcohol use: Yes    Alcohol/week: 0.0 oz    Comment: social   . Drug use: No  . Sexual activity: Yes    Birth control/protection: Inserts  Lifestyle  . Physical activity:    Days per week: Not on file    Minutes per session: Not on file  . Stress: Not on file  Relationships  . Social connections:    Talks on phone: Not on file    Gets together: Not on file    Attends religious service: Not on file    Active member of club or organization: Not on file    Attends meetings of clubs or organizations: Not on file  Relationship status: Not on file  . Intimate partner violence:    Fear of current or ex partner: Not on file    Emotionally abused: Not on file    Physically abused: Not on file    Forced sexual activity: Not on file  Other Topics Concern  . 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   Completed masters in Adult Education- taught at [employer]     PHYSICAL EXAM:  VS: BP 132/76 (BP Location: Left Arm, Patient Position: Sitting, Cuff Size: Normal)   Pulse 78   Temp 98.7 F (37.1 C) (Oral)   Ht 5\' 6"  (1.676 m)   Wt 205 lb (93 kg)   SpO2 99%   BMI 33.09 kg/m  Physical Exam Gen: NAD, alert, cooperative with exam, well-appearing ENT: normal lips, normal nasal mucosa, tympanic membranes clear and intact  bilaterally, normal oropharynx,  Eye: normal EOM, normal conjunctiva and lids CV:  no edema, +2 pedal pulses,    Resp: no accessory muscle use, non-labored,  Skin: no rashes, no areas of induration  Neuro: normal tone, normal sensation to touch Psych:  normal insight, alert and oriented MSK: Normal gait, normal strength       ASSESSMENT & PLAN:   Left ear pain No suggestion of infection. Could be eustachian tube dysfunction - counseled on supportive care.  - if no improvement consider prednisone or azithro

## 2018-02-13 NOTE — Assessment & Plan Note (Signed)
No suggestion of infection. Could be eustachian tube dysfunction - counseled on supportive care.  - if no improvement consider prednisone or azithro

## 2018-02-13 NOTE — Patient Instructions (Signed)
Nice to meet you   Please try afrin which will help with nasal congestion but use for only three days.   Honey can help with a cough  Please give us a call back later this week if your symptoms are still occurring and we can call something in.

## 2018-02-16 ENCOUNTER — Telehealth: Payer: Self-pay

## 2018-02-16 NOTE — Telephone Encounter (Signed)
Copied from CRM [id number]. Topic: General - Other >> Feb 16, 2018 12:24 PM Gerrianne ScalePayne, Angela L wrote: Reason for CRM: pt states that Jordan LikesSchmitz advised her that if she isn't getting any better that he would prescribe her prednisone for her ears and congestion she would like for it to be sent to the     [phone] - [place], [place] - [address] [facility]

## 2018-02-19 MED ORDER — PREDNISONE 5 MG PO TABS: ORAL_TABLET | ORAL | 0 refills | Status: DC

## 2018-02-19 NOTE — Addendum Note (Signed)
Addended by: Myra RudeSCHMITZ, Lizzett Nobile E on: 02/19/2018 01:15 PM   Modules accepted: Orders

## 2018-04-10 ENCOUNTER — Ambulatory Visit: Payer: 59 | Admitting: Nurse Practitioner

## 2018-04-10 ENCOUNTER — Encounter: Payer: Self-pay | Admitting: Nurse Practitioner

## 2018-04-10 VITALS — BP 108/76 | HR 61 | Temp 98.6°F | Ht 66.0 in | Wt 208.0 lb

## 2018-04-10 DIAGNOSIS — J392 Other diseases of pharynx: Secondary | ICD-10-CM | POA: Diagnosis not present

## 2018-04-10 MED ORDER — VALACYCLOVIR HCL 1 G PO TABS: 2000.0000 mg | ORAL_TABLET | Freq: Two times a day (BID) | ORAL | 0 refills | Status: DC

## 2018-04-10 NOTE — Progress Notes (Signed)
Subjective:  Patient ID: Taylor DanaKatie W Hicks, female    DOB: 1986/07/08  Age: 32 y.o. MRN: [id number]  CC: Ear Pain (right ear pain going down toward jaw line,pressure,felt like sore throat when swallow. this has been going on for 3 days. took mucinex D. )  Otalgia   There is pain in the right ear. This is a new problem. The current episode started in the past 7 days. The problem has been gradually worsening. There has been no fever. Associated symptoms include a sore throat. Pertinent negatives include no abdominal pain, coughing, diarrhea, ear discharge, headaches, hearing loss, neck pain, rash, rhinorrhea or vomiting. She has tried ear drops for the symptoms. The treatment provided no relief.   Reviewed past Medical, Social and Family history today.  Outpatient Medications Prior to Visit  Medication Sig Dispense Refill  . azelastine (ASTELIN) 0.1 % nasal spray Place 2 sprays into both nostrils daily. Use in each nostril as directed 30 mL 2  . cetirizine (ZYRTEC) 10 MG tablet Take 10 mg by mouth daily as needed for allergies.     . fluticasone (FLONASE) 50 MCG/ACT nasal spray Place 2 sprays into both nostrils daily. 16 g 1  . ibuprofen (ADVIL,MOTRIN) 800 MG tablet Take 1 tablet (800 mg total) by mouth every 6 (six) hours as needed. 30 tablet 1  . Propylene Glycol (SYSTANE COMPLETE) 0.6 % SOLN Place 1 drop into both eyes 2 (two) times daily.    . predniSONE (DELTASONE) 5 MG tablet Take 6 pills for first day, 5 pills second day, 4 pills third day, 3 pills fourth day, 2 pills the fifth day, and 1 pill sixth day. (Patient not taking: Reported on 04/10/2018) 21 tablet 0   No facility-administered medications prior to visit.     ROS See HPI  Objective:  BP 108/76   Pulse 61   Temp 98.6 F (37 C) (Oral)   Ht 5\' 6"  (1.676 m)   Wt 208 lb (94.3 kg)   SpO2 99%   BMI 33.57 kg/m   BP Readings from Last 3 Encounters:  04/10/18 108/76  02/13/18 132/76  11/30/17 126/70    Wt Readings from Last  3 Encounters:  04/10/18 208 lb (94.3 kg)  02/13/18 205 lb (93 kg)  11/30/17 201 lb 4 oz (91.3 kg)    Physical Exam  Constitutional: No distress.  HENT:  Right Ear: External ear and ear canal normal. No mastoid tenderness. Tympanic membrane is not injected, not erythematous and not bulging. A middle ear effusion is present.  Left Ear: Tympanic membrane, external ear and ear canal normal. No mastoid tenderness. Tympanic membrane is not injected, not erythematous and not bulging.  No middle ear effusion.  Nose: Nose normal.  Mouth/Throat: Uvula is midline. Oral lesions present. Posterior oropharyngeal erythema present. No oropharyngeal exudate or posterior oropharyngeal edema. Tonsils are 0 on the right. Tonsils are 0 on the left.    Lymphadenopathy:    She has cervical adenopathy.  Vitals reviewed.   Lab Results  Component Value Date   WBC 6.1 10/18/2017   HGB 13.3 10/18/2017   HCT 40.9 10/18/2017   PLT 148 (L) 10/18/2017   GLUCOSE 86 06/21/2017   CHOL 129 06/21/2017   TRIG 104.0 06/21/2017   HDL 42.20 06/21/2017   LDLCALC 66 06/21/2017   ALT 15 06/21/2017   AST 15 06/21/2017   NA 138 06/21/2017   K 4.3 06/21/2017   CL 104 06/21/2017   CREATININE 0.70 06/21/2017  BUN 13 06/21/2017   CO2 28 06/21/2017   TSH 0.62 06/21/2017    Assessment & Plan:   Taylor Hicks was seen today for ear pain.  Diagnoses and all orders for this visit:  Pharyngeal ulcer -     valACYclovir (VALTREX) 1000 MG tablet; Take 2 tablets (2,000 mg total) by mouth 2 (two) times daily.   I am having Taylor Hicks start on valACYclovir. I am also having her maintain her cetirizine, Propylene Glycol, ibuprofen, fluticasone, azelastine, and predniSONE.  Meds ordered this encounter  Medications  . valACYclovir (VALTREX) 1000 MG tablet    Sig: Take 2 tablets (2,000 mg total) by mouth 2 (two) times daily.    Dispense:  4 tablet    Refill:  0    Order Specific Question:   Supervising Provider    Answer:    Dianne DunARON, TALIA M [[id number]]    Follow-up: Return if symptoms worsen or fail to improve.  Alysia Pennaharlotte Isamar Nazir, NP

## 2018-04-10 NOTE — Patient Instructions (Signed)
May also use tylenol or ibuprofen for pain.  May also use warm salt water gaggle as needed to soothe throat.  Oral Ulcers Oral ulcers are sores inside the mouth or near the mouth. They may be called canker sores or cold sores, which are two types of oral ulcers. Many oral ulcers are harmless and go away on their own. In some cases, oral ulcers may require medical care to determine the cause and proper treatment. What are the causes? Common causes of this condition include:  Viral, bacterial, or fungal infection.  Emotional stress.  Foods or chemicals that irritate the mouth.  Injury or physical irritation of the mouth.  Medicines.  Allergies.  Tobacco use.  Less common causes include:  Skin disease.  A type of herpes virus infection (herpes simplexor herpes zoster).  Oral cancer.  In some cases, the cause of this condition may not be known. What increases the risk? Oral ulcers are more likely to develop in:  People who wear dental braces, dentures, or retainers.  People who do not keep their mouth clean or brush their teeth regularly.  People who have sensitive skin.  People who have conditions that affect the entire body (systemic conditions), such as immune disorders.  What are the signs or symptoms? The main symptom of this condition is one or more oval-shaped or round ulcers that have red borders. Details about symptoms may vary depending on the cause.  Location of the ulcers. They may be inside the mouth, on the gums, or on the insides of the lips or cheeks. They may also be on the lips or on skin that is near the mouth, such as the cheeks and chin.  Pain. Ulcers can be painful and uncomfortable, or they can be painless.  Appearance of the ulcers. They may look like red blisters and be filled with fluid, or they may be white or yellow patches.  Frequency of outbreaks. Ulcers may go away permanently after one outbreak, or they may come back (recur) often or  rarely.  How is this diagnosed? This condition is diagnosed with a physical exam. Your health care provider may ask you questions about your lifestyle and your medical history. You may have tests, including:  Blood tests.  Removal of a small number of cells from an ulcer to be examined under a microscope (biopsy).  How is this treated? This condition is treated by managing any pain and discomfort, and by treating the underlying cause of the ulcers, if necessary. Usually, oral ulcers resolve by themselves in 1-2 weeks. You may be told to keep your mouth clean and avoid things that cause or irritate your ulcers. Your health care provider may prescribe medicines to reduce pain and discomfort or treat the underlying cause, if this applies. Follow these instructions at home: Lifestyle  Follow instructions from your health care provider about eating or drinking restrictions. ? Drink enough fluid to keep your urine clear or pale yellow. ? Avoid foods and drinks that irritate your ulcers.  Avoid tobacco products, including cigarettes, chewing tobacco, or e-cigarettes. If you need help quitting, ask your health care provider.  Avoid excessive alcohol use. Oral Hygiene  Avoid physical or chemical irritants that may have caused the ulcers or made them worse, such as mouthwashes that contain alcohol (ethanol). If you wear dental braces, dentures, or retainers, work with your health care provider to make sure these devices are fitted correctly.  Brush and floss your teeth at least once every day, and  get regular dental cleanings and checkups.  Gargle with a salt-water mixture 3-4 times per day or as told by your health care provider. To make a salt-water mixture, completely dissolve -1 tsp of salt in 1 cup of warm water. General instructions  Take over-the-counter and prescription medicines only as told by your health care provider.  If you have pain, wrap a cold compress in a towel and gently  press it against your face to help reduce pain.  Keep all follow-up visits as told by your health care provider. This is important. Contact a health care provider if:  You have pain that gets worse or does not get better with medicine.  You have 4 or more ulcers at one time.  You have a fever.  You have new ulcers that look or feel different from other ulcers you have.  You have inflammation in one eye or both eyes.  You have ulcers that do not go away after 10 days.  You develop new symptoms in your mouth, such as: ? Bleeding or crusting around your lips or gums. ? Tooth pain. ? Difficulty swallowing.  You develop symptoms on your skin or genitals, such as: ? A rash or blisters. ? Burning or itching sensations.  Your ulcers begin or get worse after you start a new medicine. Get help right away if:  You have difficulty breathing.  You have swelling in your face or neck.  You have excessive bleeding from your mouth.  You have severe pain. This information is not intended to replace advice given to you by your health care provider. Make sure you discuss any questions you have with your health care provider. Document Released: 09/15/2004 Document Revised: 01/11/2016 Document Reviewed: 12/24/2014 Elsevier Interactive Patient Education  [employer]2018 [employer]

## 2018-06-19 ENCOUNTER — Telehealth: Payer: 59 | Admitting: Nurse Practitioner

## 2018-06-19 DIAGNOSIS — J0101 Acute recurrent maxillary sinusitis: Secondary | ICD-10-CM

## 2018-06-19 MED ORDER — AMOXICILLIN-POT CLAVULANATE 875-125 MG PO TABS: 1.0000 | ORAL_TABLET | Freq: Two times a day (BID) | ORAL | 0 refills | Status: DC

## 2018-06-19 NOTE — Progress Notes (Signed)

## 2018-06-22 ENCOUNTER — Telehealth: Payer: Self-pay | Admitting: *Deleted

## 2018-06-22 NOTE — Telephone Encounter (Signed)
Spoke with patient and verified patient's history, vaccine, medications, allergies, pharmacy and reason for visit on 06/25/18. Information was updated.

## 2018-06-25 ENCOUNTER — Ambulatory Visit (INDEPENDENT_AMBULATORY_CARE_PROVIDER_SITE_OTHER): Payer: 59 | Admitting: Family Medicine

## 2018-06-25 ENCOUNTER — Encounter: Payer: Self-pay | Admitting: Family Medicine

## 2018-06-25 VITALS — BP 122/76 | HR 67 | Temp 98.5°F | Ht 66.0 in | Wt 216.0 lb

## 2018-06-25 DIAGNOSIS — R5383 Other fatigue: Secondary | ICD-10-CM | POA: Diagnosis not present

## 2018-06-25 DIAGNOSIS — Z0000 Encounter for general adult medical examination without abnormal findings: Secondary | ICD-10-CM

## 2018-06-25 DIAGNOSIS — Z83438 Family history of other disorder of lipoprotein metabolism and other lipidemia: Secondary | ICD-10-CM | POA: Diagnosis not present

## 2018-06-25 DIAGNOSIS — Z01419 Encounter for gynecological examination (general) (routine) without abnormal findings: Secondary | ICD-10-CM | POA: Insufficient documentation

## 2018-06-25 DIAGNOSIS — J309 Allergic rhinitis, unspecified: Secondary | ICD-10-CM | POA: Insufficient documentation

## 2018-06-25 LAB — LIPID PANEL
CHOLESTEROL: 132 mg/dL (ref 0–200)
HDL: 39.8 mg/dL (ref >39.00)
LDL CALC: 75 mg/dL (ref 0–99)
NONHDL: 92.48
Total CHOL/HDL Ratio: 3
Triglycerides: 89 mg/dL (ref 0.0–149.0)
VLDL: 17.8 mg/dL (ref 0.0–40.0)

## 2018-06-25 LAB — COMPREHENSIVE METABOLIC PANEL
ALT: 14 U/L (ref 0–35)
AST: 19 U/L (ref 0–37)
Albumin: 4.5 g/dL (ref 3.5–5.2)
Alkaline Phosphatase: 67 U/L (ref 39–117)
BUN: 10 mg/dL (ref 6–23)
CHLORIDE: 102 meq/L (ref 96–112)
CO2: 26 mEq/L (ref 19–32)
Calcium: 9.7 mg/dL (ref 8.4–10.5)
Creatinine, Ser: 0.76 mg/dL (ref 0.40–1.20)
GFR: 93.07 mL/min (ref >60.00)
GLUCOSE: 86 mg/dL (ref 70–99)
POTASSIUM: 4.4 meq/L (ref 3.5–5.1)
SODIUM: 135 meq/L (ref 135–145)
Total Bilirubin: 0.9 mg/dL (ref 0.2–1.2)
Total Protein: 8.1 g/dL (ref 6.0–8.3)

## 2018-06-25 LAB — CBC WITH DIFFERENTIAL/PLATELET
BASOS ABS: 0 10*3/uL (ref 0.0–0.1)
Basophils Relative: 0.6 % (ref 0.0–3.0)
Eosinophils Absolute: 0.1 10*3/uL (ref 0.0–0.7)
Eosinophils Relative: 1.1 % (ref 0.0–5.0)
HCT: 39.6 % (ref 36.0–46.0)
HEMOGLOBIN: 13.2 g/dL (ref 12.0–15.0)
LYMPHS ABS: 2.4 10*3/uL (ref 0.7–4.0)
Lymphocytes Relative: 43.9 % (ref 12.0–46.0)
MCHC: 33.4 g/dL (ref 30.0–36.0)
MCV: 85.2 fl (ref 78.0–100.0)
MONO ABS: 0.3 10*3/uL (ref 0.1–1.0)
Monocytes Relative: 5.8 % (ref 3.0–12.0)
NEUTROS PCT: 48.6 % (ref 43.0–77.0)
Neutro Abs: 2.7 10*3/uL (ref 1.4–7.7)
Platelets: 165 10*3/uL (ref 150.0–400.0)
RBC: 4.65 Mil/uL (ref 3.87–5.11)
RDW: 12.6 % (ref 11.5–15.5)
WBC: 5.5 10*3/uL (ref 4.0–10.5)

## 2018-06-25 LAB — TSH: TSH: 0.65 u[IU]/mL (ref 0.35–4.50)

## 2018-06-25 LAB — VITAMIN B12: VITAMIN B 12: 304 pg/mL (ref 211–911)

## 2018-06-25 LAB — VITAMIN D 25 HYDROXY (VIT D DEFICIENCY, FRACTURES): VITD: 22.02 ng/mL — ABNORMAL LOW (ref 30.00–100.00)

## 2018-06-25 MED ORDER — FLUCONAZOLE 150 MG PO TABS: ORAL_TABLET | ORAL | 3 refills | Status: DC

## 2018-06-25 MED ORDER — IBUPROFEN 800 MG PO TABS: 800.0000 mg | ORAL_TABLET | Freq: Four times a day (QID) | ORAL | 1 refills | Status: DC | PRN

## 2018-06-25 NOTE — Patient Instructions (Signed)
Great to meet you.  I will call you with your lab results from today and you can view them online.   

## 2018-06-25 NOTE — Progress Notes (Signed)
Subjective:   Patient ID: Taylor Hicks, female    DOB: [birth year], 32 y.o.   MRN: [id number]  Taylor Hicks is a pleasant 32 y.o. year old female who presents to clinic today with Annual Exam (She has been fasting for labs. She has been taking Augmentin since 06/19/18-for a sinus infection. Symptoms have improved. Wants to discss Diflucan.)  on 06/25/2018  HPI:  Has GYN- sees Dr. Langston Masker. Per pt, pap smear UTD. Has one 29 year old son, Taylor Hicks.  Health Maintenance  Topic Date Due  . PAP SMEAR  05/20/2020 (Originally 06/18/2017)  . TETANUS/TDAP  02/20/2024  . INFLUENZA VACCINE  Completed  . HIV Screening  Completed   On Augmentin for sinus infection- started on 06/19/18. On zyrtec and flonase.  Asking for diflucan for yeast infection.   Frequent yeast infections with abx use.  Does have a h/o allergic rhinitis.    Current Outpatient Medications on File Prior to Visit  Medication Sig Dispense Refill  . amoxicillin-clavulanate (AUGMENTIN) 875-125 MG tablet Take 1 tablet by mouth 2 (two) times daily. 14 tablet 0  . azelastine (ASTELIN) 0.1 % nasal spray Place 2 sprays into both nostrils daily. Use in each nostril as directed 30 mL 2  . cetirizine (ZYRTEC) 10 MG tablet Take 10 mg by mouth daily as needed for allergies.     . fluticasone (FLONASE) 50 MCG/ACT nasal spray Place 2 sprays into both nostrils daily. 16 g 1  . ibuprofen (ADVIL,MOTRIN) 800 MG tablet Take 1 tablet (800 mg total) by mouth every 6 (six) hours as needed. 30 tablet 1  . Propylene Glycol (SYSTANE COMPLETE) 0.6 % SOLN Place 1 drop into both eyes 2 (two) times daily.     No current facility-administered medications on file prior to visit.     Allergies  Allergen Reactions  . Benzoyl Peroxide Itching  . Azithromycin Hives    Past Medical History:  Diagnosis Date  . Allergy   . Chronic kidney disease 2016   UTI TURNED INTO KIDNEY INFECTION  . Family history of adverse reaction to anesthesia    GRANDMOTHER  HAD HARD TIME WAKING UP FROM ANESTHESIA  . Headache    OCCAS MIGRAINES  . History of chicken pox     Past Surgical History:  Procedure Laterality Date  . CESAREAN SECTION N/A 05/05/2014   Procedure: CESAREAN SECTION;  Surgeon: Leslie Andrea, MD;  Location: [facility];  Service: Obstetrics;  Laterality: N/A;  . EYE SURGERY  1996  . EYE SURGERY    . WOUND EXPLORATION N/A 10/25/2017   Procedure: REMOVAL OF INCISIONAL MASS;  Surgeon: Mitchel Honour, DO;  Location: [facility];  Service: Gynecology;  Laterality: N/A;    Family History  Problem Relation Age of Onset  . Diabetes Father   . Hypertension Father   . Hyperlipidemia Mother   . Diabetes Paternal Grandmother   . Diabetes Paternal Grandfather   . COPD Maternal Grandmother   . Osteoporosis Maternal Grandmother   . Cancer Maternal Grandfather        lung    Social History   Socioeconomic History  . Marital status: Married    Spouse name: Not on file  . Number of children: Not on file  . Years of education: Not on file  . Highest education level: Not on file  Occupational History  . Not on file  Social Needs  . Financial resource strain: Not on file  . Food insecurity:  Worry: Not on file    Inability: Not on file  . Transportation needs:    Medical: Not on file    Non-medical: Not on file  Tobacco Use  . Smoking status: Never Smoker  . Smokeless tobacco: Never Used  Substance and Sexual Activity  . Alcohol use: Yes    Alcohol/week: 0.0 standard drinks    Comment: social   . Drug use: No  . Sexual activity: Yes    Birth control/protection: Inserts  Lifestyle  . Physical activity:    Days per week: Not on file    Minutes per session: Not on file  . Stress: Not on file  Relationships  . Social connections:    Talks on phone: Not on file    Gets together: Not on file    Attends religious service: Not on file    Active member of club or organization: Not on file    Attends meetings of clubs or organizations:  Not on file    Relationship status: Not on file  . Intimate partner violence:    Fear of current or ex partner: Not on file    Emotionally abused: Not on file    Physically abused: Not on file    Forced sexual activity: Not on file  Other Topics Concern  . 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  Review of Systems  Constitutional: Positive for fatigue.  HENT: Negative.   Eyes: Negative.   Respiratory: Negative.   Endocrine: Negative.   Genitourinary: Positive for vaginal discharge.  Allergic/Immunologic: Negative.   Neurological: Negative.   Hematological: Negative.   Psychiatric/Behavioral: Negative.        Objective:    BP 122/76   Pulse 67   Temp 98.5 F (36.9 C) (Oral)   Ht 5\' 6"  (1.676 m)   Wt 216 lb (98 kg)   SpO2 98%   BMI 34.86 kg/m    Physical Exam   General:  Well-developed,well-nourished,in no acute distress; alert,appropriate and cooperative throughout examination Head:  normocephalic and atraumatic.   Eyes:  vision grossly intact, PERRL Ears:  R ear normal and L ear normal externally, TMs clear bilaterally Nose:  no external deformity.   Mouth:  good dentition.   Neck:  No deformities, masses, or tenderness noted. Lungs:  Normal respiratory effort, chest expands symmetrically. Lungs are clear to auscultation, no crackles or wheezes. Heart:  Normal rate and regular rhythm. S1 and S2 normal without gallop, murmur, click, rub or other extra sounds. Abdomen:  Bowel sounds positive,abdomen soft and non-tender without masses, organomegaly or hernias noted. Msk:  No deformity or scoliosis noted of thoracic or lumbar  spine.   Extremities:  No clubbing, cyanosis, edema, or deformity noted with normal full range of motion of all joints.   Neurologic:  alert & oriented X3 and gait normal.   Skin:  Intact without suspicious lesions or rashes Cervical Nodes:  No lymphadenopathy noted Axillary Nodes:  No palpable lymphadenopathy Psych:  Cognition and judgment appear intact. Alert and cooperative with normal attention span and concentration. No apparent delusions, illusions, hallucinations      Assessment &  Plan:   Well woman exam without gynecological exam  Allergic rhinitis, unspecified seasonality, unspecified trigger  Family history of hyperlipidemia - Plan: Lipid panel  Other fatigue - Plan: CBC with Differential/Platelet, Comprehensive metabolic panel, TSH, Vitamin D (25 hydroxy), B12 No follow-ups on file.

## 2018-06-25 NOTE — Assessment & Plan Note (Signed)
Reviewed preventive care protocols, scheduled due services, and updated immunizations Discussed nutrition, exercise, diet, and healthy lifestyle.  

## 2018-06-27 ENCOUNTER — Other Ambulatory Visit: Payer: Self-pay

## 2018-06-27 DIAGNOSIS — R7989 Other specified abnormal findings of blood chemistry: Secondary | ICD-10-CM

## 2018-06-27 MED ORDER — VITAMIN D3 1.25 MG (50000 UT) PO TABS: 1.0000 | ORAL_TABLET | ORAL | 0 refills | Status: DC

## 2018-08-31 IMAGING — DX DG SINUSES COMPLETE 3+V
3 series · 3 of 3 positions shown · non-contrast
Comparison: None in PACs

CLINICAL DATA: Recurring sinusitis and headaches for the past 3
weeks. No relief with steroids and antibiotics.

EXAM:
PARANASAL SINUSES - COMPLETE 3 + VIEW

[pns waters]
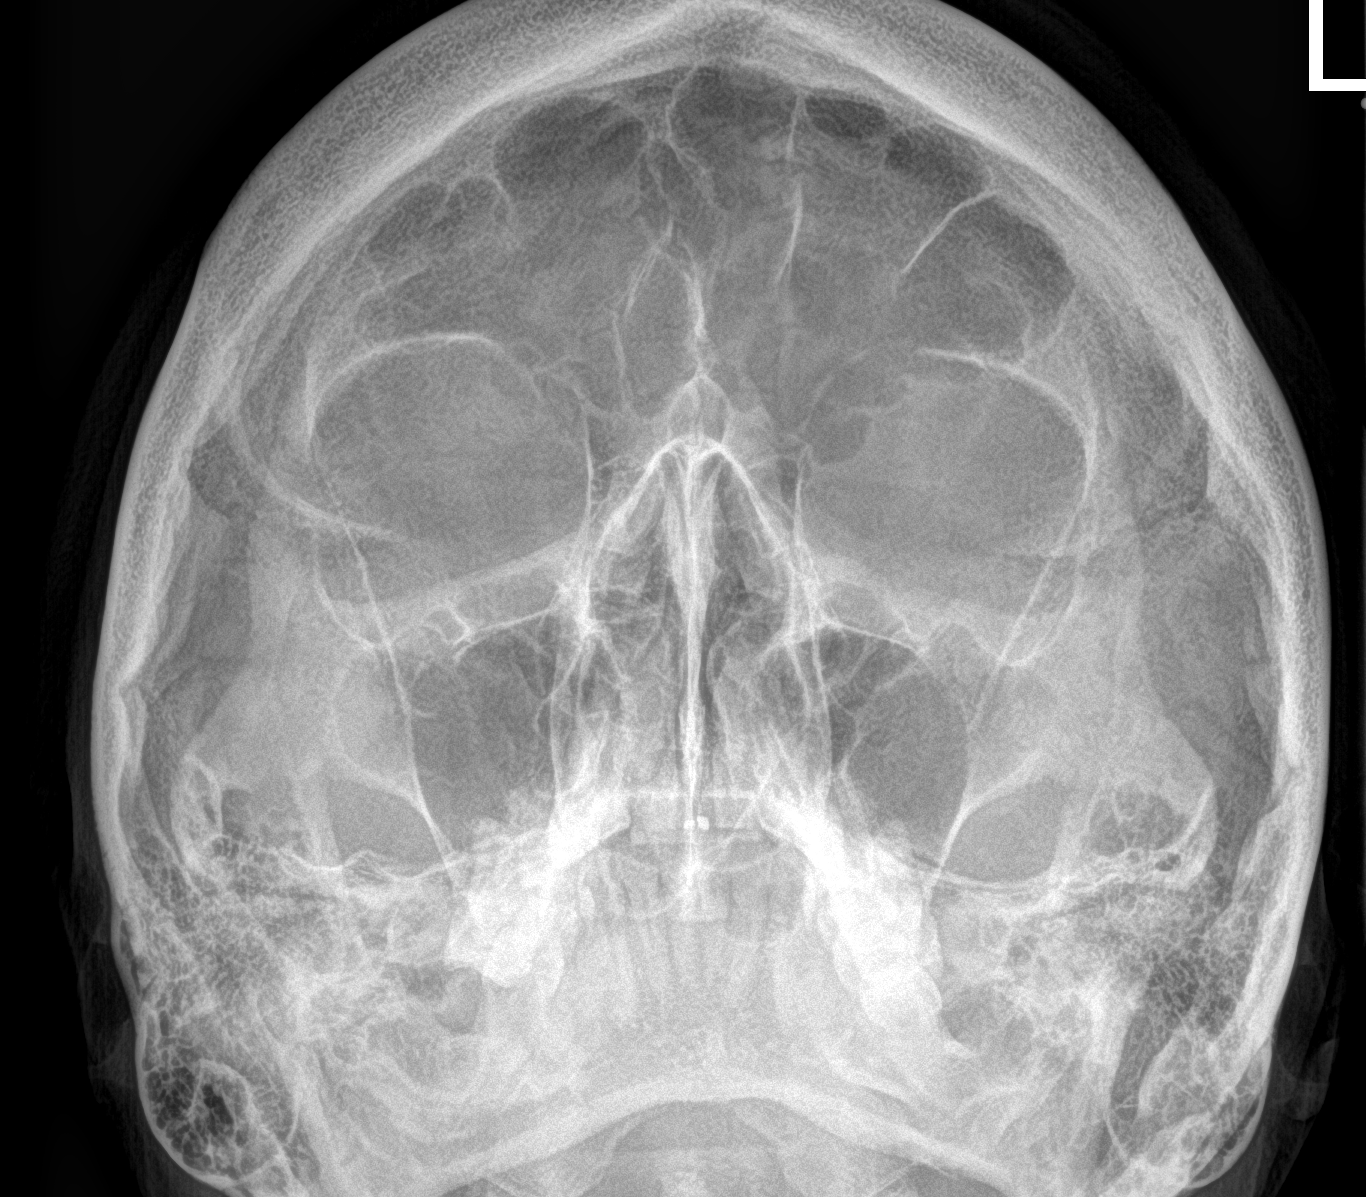

[[person_name]]
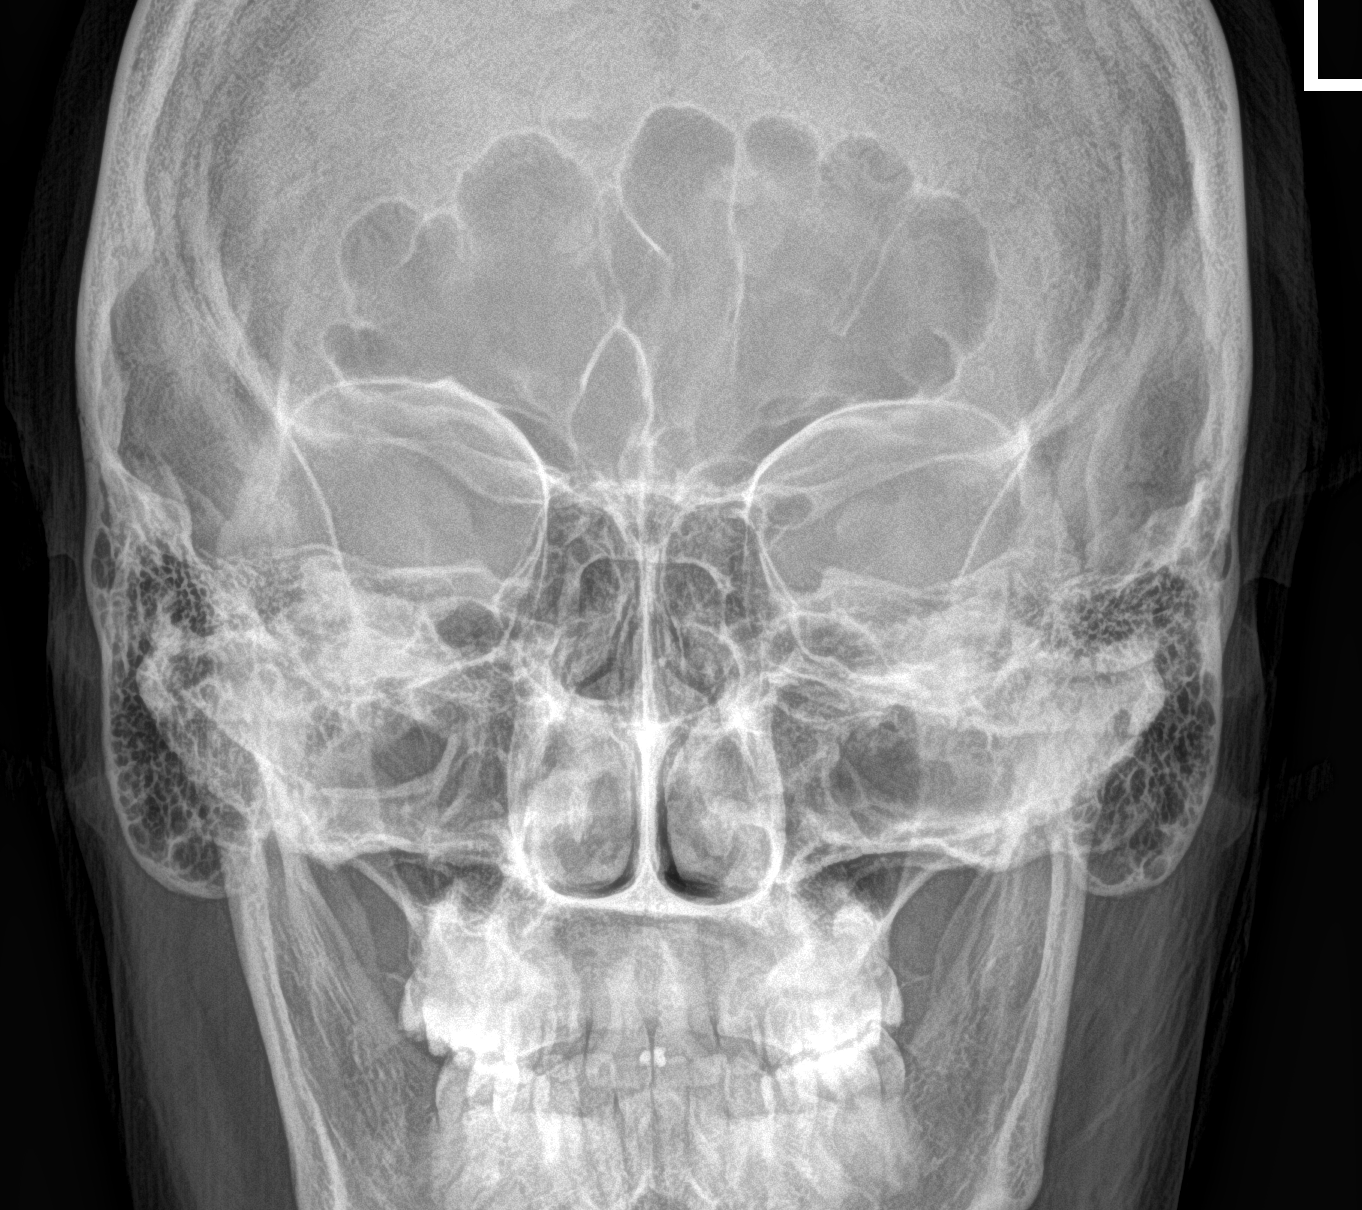

[pns lat]
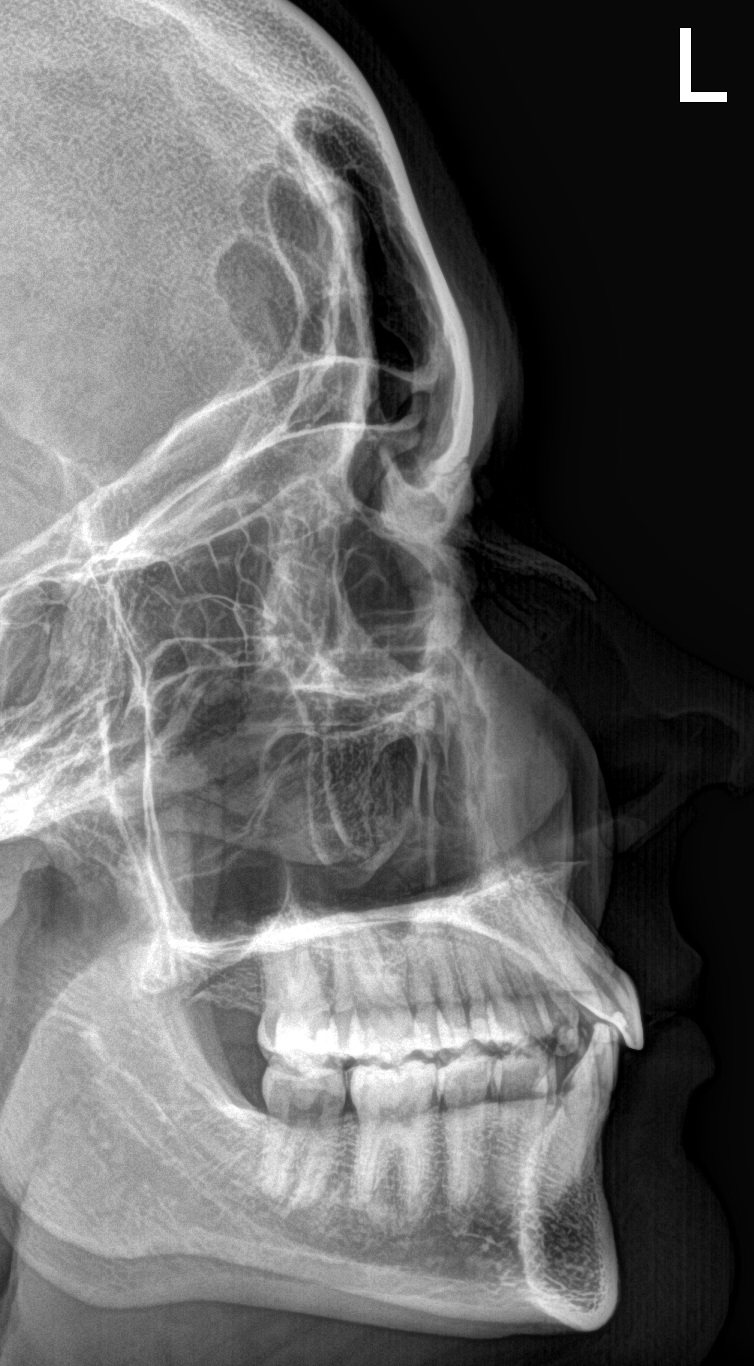

[3 of 3 positions shown; findings below may reference images not displayed]

FINDINGS: The frontal sinuses are well pneumatized and clear. The ethmoid,
maxillary, and visualized portions of the sphenoid sinus cyst are
clear. The posterior sphenoid sinus cells are excluded from the
field of view.
IMPRESSION: No radiographic evidence of acute or chronic inflammation within the
visualized portions of the paranasal sinuses. If further imaging is
desired, CT scanning of the sinuses would be the most useful next
imaging step.

## 2018-09-25 ENCOUNTER — Other Ambulatory Visit: Payer: Self-pay

## 2018-09-25 MED ORDER — OSELTAMIVIR PHOSPHATE 75 MG PO CAPS: 75.0000 mg | ORAL_CAPSULE | Freq: Every day | ORAL | 0 refills | Status: AC

## 2018-09-25 NOTE — Progress Notes (Signed)
Prophylaxis as family Dx with Flu A/thx dmf

## 2018-09-30 DIAGNOSIS — J029 Acute pharyngitis, unspecified: Secondary | ICD-10-CM | POA: Diagnosis not present

## 2018-11-14 DIAGNOSIS — Z01419 Encounter for gynecological examination (general) (routine) without abnormal findings: Secondary | ICD-10-CM | POA: Diagnosis not present

## 2018-11-14 DIAGNOSIS — Z6834 Body mass index (BMI) 34.0-34.9, adult: Secondary | ICD-10-CM | POA: Diagnosis not present

## 2018-11-19 DIAGNOSIS — R1031 Right lower quadrant pain: Secondary | ICD-10-CM | POA: Diagnosis not present

## 2018-11-19 DIAGNOSIS — R102 Pelvic and perineal pain: Secondary | ICD-10-CM | POA: Diagnosis not present

## 2019-01-04 DIAGNOSIS — R102 Pelvic and perineal pain: Secondary | ICD-10-CM | POA: Diagnosis not present

## 2019-01-04 DIAGNOSIS — N809 Endometriosis, unspecified: Secondary | ICD-10-CM | POA: Diagnosis not present

## 2019-03-27 ENCOUNTER — Other Ambulatory Visit: Payer: Self-pay | Admitting: Family Medicine

## 2019-03-27 DIAGNOSIS — H6983 Other specified disorders of Eustachian tube, bilateral: Secondary | ICD-10-CM

## 2019-03-27 DIAGNOSIS — J301 Allergic rhinitis due to pollen: Secondary | ICD-10-CM

## 2019-07-22 ENCOUNTER — Other Ambulatory Visit: Payer: Self-pay

## 2019-07-22 DIAGNOSIS — E559 Vitamin D deficiency, unspecified: Secondary | ICD-10-CM | POA: Insufficient documentation

## 2019-07-22 NOTE — Assessment & Plan Note (Addendum)
Repeat Vit D today- not currently taking any vitamin D.

## 2019-07-22 NOTE — Progress Notes (Signed)
Subjective:    Patient ID: Taylor Hicks, female    DOB: 09/17/1985, 33 y.o.   MRN: [id number]  Chief Complaint  Patient presents with  . Annual Exam    Pt her with no complaints today.  Pt will receive Flu shot.  She is fasting for lab work today,  she has not been taking Vitamin D, OTC.    HPI Patient is in today for CPX.   Has GYN- Dr.Morris- last say her on 01/04/19. Note reviewed. Restarted OCPs for endometriosis which has been helping.  Form has been faxed to [facility] requesting updated PAP for continuity of care. According to our records she is due for her flu vaccine today which she agrees to receive today.  Has one 25 year old son, Colon Branch. Starts kindergarten next year.  He's doing well.  Health Maintenance  Topic Date Due  . PAP SMEAR-Modifier  05/20/2020 (Originally 06/18/2017)  . TETANUS/TDAP  02/20/2024  . INFLUENZA VACCINE  Completed  . HIV Screening  Completed    Vitamin D deficiency- repleted with 6 week course of weekly high dose Vitamin D in 06/2018.  Not currently taking any vitamin D supplements.  Lab Results  Component Value Date   VD25OH 22.02 (L) 06/25/2018   Depression screen [facility] 2/9 07/23/2019 06/25/2018 06/21/2017  Decreased Interest 1 0 0  Down, Depressed, Hopeless 1 1 0  PHQ - 2 Score 2 1 0  Altered sleeping 1 0 0  Tired, decreased energy 1 1 1   Change in appetite 1 1 1   Feeling bad or failure about yourself  1 0 1  Trouble concentrating 0 0 0  Moving slowly or fidgety/restless 0 0 0  Suicidal thoughts 0 0 0  PHQ-9 Score 6 3 3   Difficult doing work/chores Somewhat difficult Somewhat difficult -   GAD 7 : Generalized Anxiety Score 07/23/2019 06/25/2018  Nervous, Anxious, on Edge 1 0  Control/stop worrying 1 0  Worry too much - different things 0 0  Trouble relaxing 0 0  Restless 0 0  Easily annoyed or irritable 1 1  Afraid - awful might happen 0 0  Total GAD 7 Score 3 1  Anxiety Difficulty Not difficult at all Not difficult  at all      Past Medical History:  Diagnosis Date  . Allergy   . Chronic kidney disease 2016   UTI TURNED INTO KIDNEY INFECTION  . Family history of adverse reaction to anesthesia    GRANDMOTHER HAD HARD TIME WAKING UP FROM ANESTHESIA  . Headache    OCCAS MIGRAINES  . History of chicken pox     Past Surgical History:  Procedure Laterality Date  . CESAREAN SECTION N/A 05/05/2014   Procedure: CESAREAN SECTION;  Surgeon: 13/11/2017, MD;  Location: [facility];  Service: Obstetrics;  Laterality: N/A;  . EYE SURGERY  1996  . EYE SURGERY    . WOUND EXPLORATION N/A 10/25/2017   Procedure: REMOVAL OF INCISIONAL MASS;  Surgeon: 05/07/2014, DO;  Location: [facility];  Service: Gynecology;  Laterality: N/A;    Family History  Problem Relation Age of Onset  . Diabetes Father   . Hypertension Father   . Hyperlipidemia Mother   . Diabetes Paternal Grandmother   . Diabetes Paternal Grandfather   . COPD Maternal Grandmother   . Osteoporosis Maternal Grandmother   . Cancer Maternal Grandfather        lung    Social History   Socioeconomic History  .  Marital status: Married    Spouse name: Not on file  . Number of children: Not on file  . Years of education: Not on file  . Highest education level: Not on file  Occupational History  . Not on file  Social Needs  . Financial resource strain: Not on file  . Food insecurity    Worry: Not on file    Inability: Not on file  . Transportation needs    Medical: Not on file    Non-medical: Not on file  Tobacco Use  . Smoking status: Never Smoker  . Smokeless tobacco: Never Used  Substance and Sexual Activity  . Alcohol use: Yes    Alcohol/week: 0.0 standard drinks    Comment: social   . Drug use: No  . Sexual activity: Yes    Birth control/protection: Inserts  Lifestyle  . Physical activity    Days per week: Not on file    Minutes per session: Not on file  . Stress: Not on file  Relationships  . Social Product manager on phone: Not on file    Gets together: Not on file    Attends religious service: Not on file    Active member of club or organization: Not on file    Attends meetings of clubs or organizations: Not on file    Relationship status: Not on file  . Intimate partner violence    Fear of current or ex partner: Not on file    Emotionally abused: Not on file    Physically abused: Not on file    Forced sexual activity: Not on file  Other Topics Concern  . 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 AUROVELA FE 1/20 1-20 MG-MCG tablet Take 1 tablet by mouth daily.    Marland Kitchen azelastine (ASTELIN) 0.1 % nasal spray INSTILL 2 SPRAY IN EACH NOSTRIL EVERY DAY AS DIRECTED 30 mL 2  . cetirizine (ZYRTEC) 10 MG tablet Take 10 mg by mouth daily as needed for allergies.     . fluticasone (FLONASE) 50 MCG/ACT nasal spray Place 2 sprays into both nostrils daily. 16 g 1  . Propylene Glycol (SYSTANE COMPLETE) 0.6 % SOLN Place 1 drop into both eyes 2 (two) times daily.    . fluconazole (DIFLUCAN) 150 MG tablet 1 tab by mouth every 3 days 3 tablet 3  . ibuprofen (ADVIL,MOTRIN) 800 MG tablet Take 1 tablet (800 mg total) by mouth every 6 (six) hours as needed. 30 tablet 1  . Cholecalciferol (VITAMIN D3) 1.25 MG (50000 UT) TABS Take 1 tablet by mouth once a week. After 6 weeks, switch to OTC Vit D 1600 IU daily. (Patient not taking: Reported on 07/23/2019) 6 tablet 0  . amoxicillin-clavulanate (AUGMENTIN) 875-125 MG tablet Take 1 tablet by mouth 2 (two) times daily. 14 tablet 0   No facility-administered medications prior to visit.     Allergies  Allergen Reactions  . Benzoyl Peroxide Itching  . Azithromycin Hives    Review of Systems   Constitutional: Negative for fever and malaise/fatigue.  HENT: Negative for congestion and hearing loss.   Eyes: Negative for blurred vision, discharge and redness.  Respiratory: Negative for cough and shortness of breath.   Cardiovascular: Negative for chest pain, palpitations and leg swelling.  Gastrointestinal: Negative for abdominal pain and heartburn.  Genitourinary: Negative for dysuria.  Musculoskeletal: Negative for falls.  Skin: Negative for rash.  Neurological: Negative for loss of consciousness and headaches.  Endo/Heme/Allergies: Does not bruise/bleed easily.  Psychiatric/Behavioral: Negative for depression.  All other systems reviewed and are negative.      Objective:    Physical Exam Vitals signs and nursing note reviewed.  Constitutional:      Appearance: Normal appearance. She is not ill-appearing.  HENT:     Head: Normocephalic and atraumatic.     Right Ear: External ear normal.     Left Ear: External ear normal.     Nose: Nose normal.  Eyes:     General:        Right eye: No discharge.        Left eye: No discharge.  Cardiovascular:     Rate and Rhythm: Normal rate and regular rhythm.     Heart sounds: Normal heart sounds.  Pulmonary:     Effort: Pulmonary effort is normal.     Breath sounds: No wheezing.  Abdominal:     Palpations: Abdomen is soft. There is no mass.     Tenderness: There is no abdominal tenderness. There is no guarding.  Musculoskeletal: Normal range of motion.     Right lower leg: No edema.     Left lower leg: No edema.  Skin:    General: Skin is dry.  Neurological:     Mental Status: She is alert and oriented to person, place, and time.     Deep Tendon Reflexes: Reflexes normal.  Psychiatric:        Mood and Affect: Mood normal.        Behavior: Behavior normal.     BP 118/80 (BP Location: Left Arm, Patient Position: Sitting, Cuff Size: Normal)   Pulse 80   Temp (!) 96.8 F (36 C) (Temporal)   Ht  (1.676 m)   Wt  206 lb 12.8 oz (93.8 kg)   LMP 07/15/2019   SpO2 100%   BMI 33.38 kg/m  Wt Readings from Last 3 Encounters:  07/23/19 206 lb 12.8 oz (93.8 kg)  06/25/18 216 lb (98 kg)  04/10/18 208 lb (94.3 kg)     Lab Results  Component Value Date   WBC 5.5 06/25/2018   HGB 13.2 06/25/2018   HCT 39.6 06/25/2018   PLT 165.0 06/25/2018   GLUCOSE 86 06/25/2018   CHOL 132 06/25/2018   TRIG 89.0 06/25/2018   HDL 39.80 06/25/2018   LDLCALC 75 06/25/2018   ALT 14 06/25/2018   AST 19 06/25/2018   NA 135 06/25/2018   K 4.4 06/25/2018   CL 102 06/25/2018   CREATININE 0.76 06/25/2018   BUN 10 06/25/2018   CO2 26 06/25/2018   TSH 0.65 06/25/2018    Lab Results  Component Value Date   TSH 0.65 06/25/2018   Lab Results  Component Value Date   WBC 5.5 06/25/2018   HGB 13.2 06/25/2018   HCT 39.6 06/25/2018   MCV 85.2 06/25/2018   PLT 165.0 06/25/2018   Lab Results  Component Value Date   NA 135 06/25/2018   K 4.4 06/25/2018   CO2 26 06/25/2018   GLUCOSE 86 06/25/2018   BUN 10 06/25/2018   CREATININE 0.76 06/25/2018   BILITOT 0.9 06/25/2018   ALKPHOS 67 06/25/2018  AST 19 06/25/2018   ALT 14 06/25/2018   PROT 8.1 06/25/2018   ALBUMIN 4.5 06/25/2018   CALCIUM 9.7 06/25/2018   GFR 93.07 06/25/2018   Lab Results  Component Value Date   CHOL 132 06/25/2018   Lab Results  Component Value Date   HDL 39.80 06/25/2018   Lab Results  Component Value Date   LDLCALC 75 06/25/2018   Lab Results  Component Value Date   TRIG 89.0 06/25/2018   Lab Results  Component Value Date   CHOLHDL 3 06/25/2018   No results found for: HGBA1C     Assessment & Plan:   Problem List Items Addressed This Visit      Active Problems   Well woman exam without gynecological exam - Primary    Reviewed preventive care protocols, scheduled due services, and updated immunizations Discussed nutrition, exercise, diet, and healthy lifestyle. Records requested from GYN.  Influenza vaccine given  to pt today. Orders Placed This Encounter  Procedures  . Vitamin D (25 hydroxy)  . Well woman- non DM- CBC  . Well woman- non DM- CMET  . Well woman- non DM- lipid  . well woman- non DM- TSH         Allergic rhinitis    Well controlled on zyrtec and astelin, no changes to meds.      Vitamin D deficiency    Repeat Vit D today.      Relevant Orders   Vitamin D (25 hydroxy)   Endometriosis    Followed by GYN- pain improved, periods lighter since she started OCPs.       Other Visit Diagnoses    Family history of hyperlipidemia       Relevant Orders   Well woman- non DM- lipid   Other fatigue       Relevant Orders   Well woman- non DM- CBC   Well woman- non DM- CMET   well woman- non DM- TSH   Need for immunization against influenza       Relevant Orders   Flu Vaccine QUAD 36+ mos IM (Completed)      I have discontinued Avaline W. Thackston's amoxicillin-clavulanate. I have also changed her ibuprofen. Additionally, I am having her maintain her cetirizine, Propylene Glycol, fluticasone, Vitamin D3, azelastine, fluconazole, and Aurovela FE 1/20.  Meds ordered this encounter  Medications  . fluconazole (DIFLUCAN) 150 MG tablet    Sig: 1 tab by mouth every 3 days    Dispense:  3 tablet    Refill:  3  . ibuprofen (ADVIL) 800 MG tablet    Sig: Take 1 tablet (800 mg total) by mouth every 6 (six) hours as needed.    Dispense:  30 tablet    Refill:  1   This visit occurred during the SARS-CoV-2 public health emergency.  Safety protocols were in place, including screening questions prior to the visit, additional usage of staff PPE, and extensive cleaning of exam room while observing appropriate contact time as indicated for disinfecting solutions.      Ruthe Mannanalia Tavonna Worthington, MD

## 2019-07-22 NOTE — Assessment & Plan Note (Signed)
Well controlled on zyrtec and astelin, no changes to meds.

## 2019-07-22 NOTE — Assessment & Plan Note (Addendum)
Reviewed preventive care protocols, scheduled due services, and updated immunizations Discussed nutrition, exercise, diet, and healthy lifestyle. Records requested from GYN.  Influenza vaccine given to pt today. Orders Placed This Encounter  Procedures  . Vitamin D (25 hydroxy)  . Well woman- non DM- CBC  . Well woman- non DM- CMET  . Well woman- non DM- lipid  . well woman- non DM- TSH

## 2019-07-23 ENCOUNTER — Encounter: Payer: Self-pay | Admitting: Family Medicine

## 2019-07-23 ENCOUNTER — Ambulatory Visit (INDEPENDENT_AMBULATORY_CARE_PROVIDER_SITE_OTHER): Payer: 59 | Admitting: Family Medicine

## 2019-07-23 VITALS — BP 118/80 | HR 80 | Temp 96.8°F | Ht 66.0 in | Wt 206.8 lb

## 2019-07-23 DIAGNOSIS — Z23 Encounter for immunization: Secondary | ICD-10-CM

## 2019-07-23 DIAGNOSIS — J309 Allergic rhinitis, unspecified: Secondary | ICD-10-CM

## 2019-07-23 DIAGNOSIS — Z0000 Encounter for general adult medical examination without abnormal findings: Secondary | ICD-10-CM | POA: Diagnosis not present

## 2019-07-23 DIAGNOSIS — E559 Vitamin D deficiency, unspecified: Secondary | ICD-10-CM | POA: Diagnosis not present

## 2019-07-23 DIAGNOSIS — Z83438 Family history of other disorder of lipoprotein metabolism and other lipidemia: Secondary | ICD-10-CM | POA: Diagnosis not present

## 2019-07-23 DIAGNOSIS — N809 Endometriosis, unspecified: Secondary | ICD-10-CM

## 2019-07-23 DIAGNOSIS — R5383 Other fatigue: Secondary | ICD-10-CM

## 2019-07-23 LAB — CBC WITH DIFFERENTIAL/PLATELET
Basophils Absolute: 0 10*3/uL (ref 0.0–0.1)
Basophils Relative: 0.6 % (ref 0.0–3.0)
Eosinophils Absolute: 0.1 10*3/uL (ref 0.0–0.7)
Eosinophils Relative: 1 % (ref 0.0–5.0)
HCT: 43.5 % (ref 36.0–46.0)
Hemoglobin: 14.3 g/dL (ref 12.0–15.0)
Lymphocytes Relative: 33.4 % (ref 12.0–46.0)
Lymphs Abs: 2 10*3/uL (ref 0.7–4.0)
MCHC: 32.9 g/dL (ref 30.0–36.0)
MCV: 86.4 fl (ref 78.0–100.0)
Monocytes Absolute: 0.5 10*3/uL (ref 0.1–1.0)
Monocytes Relative: 7.9 % (ref 3.0–12.0)
Neutro Abs: 3.4 10*3/uL (ref 1.4–7.7)
Neutrophils Relative %: 57.1 % (ref 43.0–77.0)
Platelets: 164 10*3/uL (ref 150.0–400.0)
RBC: 5.03 Mil/uL (ref 3.87–5.11)
RDW: 12.7 % (ref 11.5–15.5)
WBC: 5.9 10*3/uL (ref 4.0–10.5)

## 2019-07-23 LAB — COMPREHENSIVE METABOLIC PANEL
ALT: 15 U/L (ref 0–35)
AST: 14 U/L (ref 0–37)
Albumin: 4.4 g/dL (ref 3.5–5.2)
Alkaline Phosphatase: 62 U/L (ref 39–117)
BUN: 16 mg/dL (ref 6–23)
CO2: 29 mEq/L (ref 19–32)
Calcium: 9.5 mg/dL (ref 8.4–10.5)
Chloride: 101 mEq/L (ref 96–112)
Creatinine, Ser: 0.81 mg/dL (ref 0.40–1.20)
GFR: 80.84 mL/min (ref >60.00)
Glucose, Bld: 91 mg/dL (ref 70–99)
Potassium: 4.1 mEq/L (ref 3.5–5.1)
Sodium: 137 mEq/L (ref 135–145)
Total Bilirubin: 1.1 mg/dL (ref 0.2–1.2)
Total Protein: 8 g/dL (ref 6.0–8.3)

## 2019-07-23 LAB — LIPID PANEL
Cholesterol: 179 mg/dL (ref 0–200)
HDL: 44.1 mg/dL (ref >39.00)
LDL Cholesterol: 118 mg/dL — ABNORMAL HIGH (ref 0–99)
NonHDL: 135.26
Total CHOL/HDL Ratio: 4
Triglycerides: 86 mg/dL (ref 0.0–149.0)
VLDL: 17.2 mg/dL (ref 0.0–40.0)

## 2019-07-23 LAB — TSH: TSH: 0.63 u[IU]/mL (ref 0.35–4.50)

## 2019-07-23 LAB — VITAMIN D 25 HYDROXY (VIT D DEFICIENCY, FRACTURES): VITD: 31.93 ng/mL (ref 30.00–100.00)

## 2019-07-23 MED ORDER — FLUCONAZOLE 150 MG PO TABS: ORAL_TABLET | ORAL | 3 refills | Status: DC

## 2019-07-23 MED ORDER — IBUPROFEN 800 MG PO TABS: 800.0000 mg | ORAL_TABLET | Freq: Four times a day (QID) | ORAL | 1 refills | Status: DC | PRN

## 2019-07-23 NOTE — Assessment & Plan Note (Signed)
Followed by GYN- pain improved, periods lighter since she started OCPs.

## 2019-07-23 NOTE — Patient Instructions (Addendum)
Great to see you. I will call you with your lab results from today and you can view them online.   Happy Holidays!  Please give hugs to Shanon Brow and your mom for me.  Please call [place] at [phone]- please ask to see Dr. Zella Ball (tell her I sent you).

## 2019-08-06 ENCOUNTER — Other Ambulatory Visit: Payer: Self-pay

## 2019-08-06 MED ORDER — IBUPROFEN 800 MG PO TABS: 800.0000 mg | ORAL_TABLET | Freq: Four times a day (QID) | ORAL | 1 refills | Status: DC | PRN

## 2019-08-06 NOTE — Telephone Encounter (Signed)
Last OV 07/23/19 Last fill 07/23/19  #30/1

## 2019-11-17 ENCOUNTER — Telehealth: Payer: 59 | Admitting: Family

## 2019-11-17 DIAGNOSIS — J301 Allergic rhinitis due to pollen: Secondary | ICD-10-CM

## 2019-11-17 NOTE — Progress Notes (Signed)
E visit for Allergic Rhinitis We are sorry that you are not feeling well.  Here is how we plan to help!  Based on what you have shared with me it looks like you have Allergic Rhinitis.  Rhinitis is when a reaction occurs that causes nasal congestion, runny nose, sneezing, and itching.  Most types of rhinitis are caused by an inflammation and are associated with symptoms in the eyes ears or throat. There are several types of rhinitis.  The most common are acute rhinitis, which is usually caused by a viral illness, allergic or seasonal rhinitis, and nonallergic or year-round rhinitis.  Nasal allergies occur certain times of the year.  Allergic rhinitis is caused when allergens in the air trigger the release of histamine in the body.  Histamine causes itching, swelling, and fluid to build up in the fragile linings of the nasal passages, sinuses and eyelids.  An itchy nose and clear discharge are common.  I recommend the following over the counter treatments: You should take a daily dose of antihistamine and Xyzal 5 mg take 1 tablet daily  I also would recommend you continue your nasal spray: Flonase 2 sprays into each nostril once daily    HOME CARE:   You can use an over-the-counter saline nasal spray as needed  Avoid areas where there is heavy dust, mites, or molds  Stay indoors on windy days during the pollen season  Keep windows closed in home, at least in bedroom; use air conditioner.  Use high-efficiency house air filter  Keep windows closed in car, turn AC on re-circulate  Avoid playing out with dog during pollen season  GET HELP RIGHT AWAY IF:   If your symptoms do not improve within 10 days  You become short of breath  You develop yellow or green discharge from your nose for over 3 days  You have coughing fits  MAKE SURE YOU:   Understand these instructions  Will watch your condition  Will get help right away if you are not doing well or get worse  Thank you  for choosing an e-visit. Your e-visit answers were reviewed by a board certified advanced clinical practitioner to complete your personal care plan. Depending upon the condition, your plan could have included both over the counter or prescription medications. Please review your pharmacy choice. Be sure that the pharmacy you have chosen is open so that you can pick up your prescription now.  If there is a problem you may message your provider in MyChart to have the prescription routed to another pharmacy. Your safety is important to [place]. If you have drug allergies check your prescription carefully.  For the next 24 hours, you can use MyChart to ask questions about today's visit, request a non-urgent call back, or ask for a work or school excuse from your e-visit provider. You will get an email in the next two days asking about your experience. I hope that your e-visit has been valuable and will speed your recovery.     Approximately 5 minutes was spent documenting and reviewing patient's chart.

## 2020-08-25 ENCOUNTER — Telehealth: Payer: 59 | Admitting: Nurse Practitioner

## 2020-08-25 DIAGNOSIS — J00 Acute nasopharyngitis [common cold]: Secondary | ICD-10-CM | POA: Diagnosis not present

## 2020-08-25 NOTE — Progress Notes (Signed)
We are sorry you are not feeling well.  Here is how we plan to help!  Based on what you have shared with me, it looks like you may have a viral upper respiratory infection.  Upper respiratory infections are caused by a large number of viruses; however, rhinovirus is the most common cause.   You may want to considered covid testing. The omicron variant is just like the common cold. Testing can be done at any urgent care or many local pharmacies.  Symptoms vary from person to person, with common symptoms including sore throat, cough, fatigue or lack of energy and feeling of general discomfort.  A low-grade fever of up to 100.4 may present, but is often uncommon.  Symptoms vary however, and are closely related to a person's age or underlying illnesses.  The most common symptoms associated with an upper respiratory infection are nasal discharge or congestion, cough, sneezing, headache and pressure in the ears and face.  These symptoms usually persist for about 3 to 10 days, but can last up to 2 weeks.  It is important to know that upper respiratory infections do not cause serious illness or complications in most cases.    Upper respiratory infections can be transmitted from person to person, with the most common method of transmission being a person's hands.  The virus is able to live on the skin and can infect other persons for up to 2 hours after direct contact.  Also, these can be transmitted when someone coughs or sneezes; thus, it is important to cover the mouth to reduce this risk.  To keep the spread of the illness at bay, good hand hygiene is very important.  This is an infection that is most likely caused by a virus. There are no specific treatments other than to help you with the symptoms until the infection runs its course.  We are sorry you are not feeling well.  Here is how we plan to help!   For nasal congestion, you may use an oral decongestants such as Mucinex D or if you have glaucoma or  high blood pressure use plain Mucinex.  Saline nasal spray or nasal drops can help and can safely be used as often as needed for congestion.  Continue your flonase. The symptoms will last 7-10 days.  If you do not have a history of heart disease, hypertension, diabetes or thyroid disease, prostate/bladder issues or glaucoma, you may also use Sudafed to treat nasal congestion.  It is highly recommended that you consult with a pharmacist or your primary care physician to ensure this medication is safe for you to take.     If you have a cough, you may use cough suppressants such as Delsym and Robitussin.  If you have glaucoma or high blood pressure, you can also use Coricidin HBP.     If you have a sore or scratchy throat, use a saltwater gargle-  to  teaspoon of salt dissolved in a 4-ounce to 8-ounce glass of warm water.  Gargle the solution for approximately 15-30 seconds and then spit.  It is important not to swallow the solution.  You can also use throat lozenges/cough drops and Chloraseptic spray to help with throat pain or discomfort.  Warm or cold liquids can also be helpful in relieving throat pain.  For headache, pain or general discomfort, you can use Ibuprofen or Tylenol as directed.   Some authorities believe that zinc sprays or the use of Echinacea may shorten the course of  your symptoms.   HOME CARE . Only take medications as instructed by your medical team. . Be sure to drink plenty of fluids. Water is fine as well as fruit juices, sodas and electrolyte beverages. You may want to stay away from caffeine or alcohol. If you are nauseated, try taking small sips of liquids. How do you know if you are getting enough fluid? Your urine should be a pale yellow or almost colorless. . Get rest. . Taking a steamy shower or using a humidifier may help nasal congestion and ease sore throat pain. You can place a towel over your head and breathe in the steam from hot water coming from a  faucet. . Using a saline nasal spray works much the same way. . Cough drops, hard candies and sore throat lozenges may ease your cough. . Avoid close contacts especially the very young and the elderly . Cover your mouth if you cough or sneeze . Always remember to wash your hands.   GET HELP RIGHT AWAY IF: . You develop worsening fever. . If your symptoms do not improve within 10 days . You develop yellow or green discharge from your nose over 3 days. . You have coughing fits . You develop a severe head ache or visual changes. . You develop shortness of breath, difficulty breathing or start having chest pain . Your symptoms persist after you have completed your treatment plan  MAKE SURE YOU   Understand these instructions.  Will watch your condition.  Will get help right away if you are not doing well or get worse.  Your e-visit answers were reviewed by a board certified advanced clinical practitioner to complete your personal care plan. Depending upon the condition, your plan could have included both over the counter or prescription medications. Please review your pharmacy choice. If there is a problem, you may call our nursing hot line at and have the prescription routed to another pharmacy. Your safety is important to [place]. If you have drug allergies check your prescription carefully.   You can use MyChart to ask questions about today's visit, request a non-urgent call back, or ask for a work or school excuse for 24 hours related to this e-Visit. If it has been greater than 24 hours you will need to follow up with your provider, or enter a new e-Visit to address those concerns. You will get an e-mail in the next two days asking about your experience.  I hope that your e-visit has been valuable and will speed your recovery. Thank you for using e-visits.   5-10 minutes spent reviewing and documenting in chart.

## 2020-08-27 ENCOUNTER — Telehealth (INDEPENDENT_AMBULATORY_CARE_PROVIDER_SITE_OTHER): Payer: 59 | Admitting: Family Medicine

## 2020-08-27 ENCOUNTER — Encounter: Payer: Self-pay | Admitting: Family Medicine

## 2020-08-27 VITALS — Ht 66.0 in | Wt 200.0 lb

## 2020-08-27 DIAGNOSIS — J029 Acute pharyngitis, unspecified: Secondary | ICD-10-CM | POA: Diagnosis not present

## 2020-08-27 MED ORDER — AMOXICILLIN 500 MG PO CAPS: 500.0000 mg | ORAL_CAPSULE | Freq: Two times a day (BID) | ORAL | 0 refills | Status: AC

## 2020-08-27 NOTE — Progress Notes (Signed)
Virtual Visit via Video Note  I connected with Taylor Hicks on 08/27/20 at 11:30 AM EST by a video enabled telemedicine application and verified that I am speaking with the correct person using two identifiers. Location patient: home Location provider: work or home office Persons participating in the virtual visit: patient, provider  I discussed the limitations of evaluation and management by telemedicine and the availability of in person appointments. The patient expressed understanding and agreed to proceed.  Chief Complaint  Patient presents with  . Acute Visit    Patient having scratchy throat, cough w/yellow phlegm, nasal congestion x 4 days.  Things have gotten better other than having a ST.  She has been taking Ibuprofen, nasal spray. Declines flu and covid vaccines.     HPI: Taylor Hicks is a 35 y.o. female who complains of 4 day h/o scratchy throat, PND, cough that is productive at times with yellow mucous. All symptoms have improved/resolved other than sore throat. She notes worsening of this and now significant pain when she swallows. No fever, chills.  She did e-visit on 1/4 (Tuesday). Pt has had strep a few times as an adult and feels current symptoms are similar to past strep infection.  Past Medical History:  Diagnosis Date  . Allergy   . Chronic kidney disease 2016   UTI TURNED INTO KIDNEY INFECTION  . Family history of adverse reaction to anesthesia    GRANDMOTHER HAD HARD TIME WAKING UP FROM ANESTHESIA  . Headache    OCCAS MIGRAINES  . History of chicken pox     Past Surgical History:  Procedure Laterality Date  . CESAREAN SECTION N/A 05/05/2014   Procedure: CESAREAN SECTION;  Surgeon: Taylor Andrea, MD;  Location: [facility];  Service: Obstetrics;  Laterality: N/A;  . EYE SURGERY  1996  . EYE SURGERY    . WOUND EXPLORATION N/A 10/25/2017   Procedure: REMOVAL OF INCISIONAL MASS;  Surgeon: Taylor Honour, DO;  Location: [facility];  Service: Gynecology;  Laterality:  N/A;    Family History  Problem Relation Age of Onset  . Diabetes Father   . Hypertension Father   . Hyperlipidemia Mother   . Diabetes Paternal Grandmother   . Diabetes Paternal Grandfather   . COPD Maternal Grandmother   . Osteoporosis Maternal Grandmother   . Cancer Maternal Grandfather        lung    Social History   Tobacco Use  . Smoking status: Never Smoker  . Smokeless tobacco: Never Used  Vaping Use  . Vaping Use: Never used  Substance Use Topics  . Alcohol use: Yes    Alcohol/week: 0.0 standard drinks    Comment: social   . Drug use: No     Current Outpatient Medications:  .  AUROVELA FE 1/20 1-20 MG-MCG tablet, Take 1 tablet by mouth daily., Disp: , Rfl:  .  azelastine (ASTELIN) 0.1 % nasal spray, INSTILL 2 SPRAY IN EACH NOSTRIL EVERY DAY AS DIRECTED, Disp: 30 mL, Rfl: 2 .  cetirizine (ZYRTEC) 10 MG tablet, Take 10 mg by mouth daily as needed for allergies. , Disp: , Rfl:  .  Cholecalciferol (VITAMIN D3) 1.25 MG (50000 UT) TABS, Take 1 tablet by mouth once a week. After 6 weeks, switch to OTC Vit D 1600 IU daily., Disp: 6 tablet, Rfl: 0 .  fluconazole (DIFLUCAN) 150 MG tablet, 1 tab by mouth every 3 days, Disp: 3 tablet, Rfl: 3 .  fluticasone (FLONASE) 50 MCG/ACT nasal spray,  Place 2 sprays into both nostrils daily., Disp: 16 g, Rfl: 1 .  ibuprofen (ADVIL) 800 MG tablet, Take 1 tablet (800 mg total) by mouth every 6 (six) hours as needed., Disp: 30 tablet, Rfl: 1 .  Propylene Glycol 0.6 % SOLN, Place 1 drop into both eyes 2 (two) times daily., Disp: , Rfl:  .  valACYclovir (VALTREX) 1000 MG tablet, SMARTSIG:1 Tablet(s) By Mouth Every 12 Hours, Disp: , Rfl:   Allergies  Allergen Reactions  . Benzoyl Peroxide Itching  . Azithromycin Hives      ROS: See pertinent positives and negatives per HPI.   EXAM:  VITALS per patient if applicable: Ht 5\' 6"  (1.676 m)   Wt 200 lb (90.7 kg) Comment: pt reported  BMI 32.28 kg/m    GENERAL: alert, oriented, in no  acute distress  HEENT: atraumatic, conjunctiva clear, no obvious abnormalities on inspection of external nose and ears  NECK: normal movements of the head and neck  LUNGS: on inspection no signs of respiratory distress, breathing rate appears normal, no obvious gross SOB, gasping or wheezing, no conversational dyspnea  CV: no obvious cyanosis  PSYCH/NEURO: pleasant and cooperative, no obvious depression or anxiety, speech and thought processing grossly intact   ASSESSMENT AND PLAN: 1. Pharyngitis, unspecified etiology - cont with supportive care - increased fluids, salt water gargles, lozenges and/or throat spray Rx: - amoxicillin (AMOXIL) 500 MG capsule; Take 1 capsule (500 mg total) by mouth 2 (two) times daily for 10 days.  Dispense: 20 capsule; Refill: 0 - cont tylenol or ibuprofen PRN - f/u if symptoms worsen or do not improve in 7-10 days    I discussed the assessment and treatment plan with the patient. The patient was provided an opportunity to ask questions and all were answered. The patient agreed with the plan and demonstrated an understanding of the instructions.   The patient was advised to call back or seek an in-person evaluation if the symptoms worsen or if the condition fails to improve as anticipated.   9-10, DO

## 2020-09-08 ENCOUNTER — Ambulatory Visit: Payer: 59 | Admitting: Nurse Practitioner

## 2020-10-05 ENCOUNTER — Other Ambulatory Visit (HOSPITAL_COMMUNITY)
Admission: RE | Admit: 2020-10-05 | Discharge: 2020-10-05 | Disposition: A | Discharge status: Home / Self Care | Payer: 59 | Source: Ambulatory Visit | Attending: Nurse Practitioner | Admitting: Nurse Practitioner

## 2020-10-05 ENCOUNTER — Other Ambulatory Visit: Payer: Self-pay

## 2020-10-05 ENCOUNTER — Ambulatory Visit (INDEPENDENT_AMBULATORY_CARE_PROVIDER_SITE_OTHER): Payer: 59 | Admitting: Nurse Practitioner

## 2020-10-05 ENCOUNTER — Encounter: Payer: Self-pay | Admitting: Nurse Practitioner

## 2020-10-05 VITALS — BP 120/70 | HR 74 | Temp 98.1°F | Ht 66.0 in | Wt 220.8 lb

## 2020-10-05 DIAGNOSIS — N760 Acute vaginitis: Secondary | ICD-10-CM | POA: Insufficient documentation

## 2020-10-05 DIAGNOSIS — Z13220 Encounter for screening for lipoid disorders: Secondary | ICD-10-CM | POA: Diagnosis not present

## 2020-10-05 DIAGNOSIS — E559 Vitamin D deficiency, unspecified: Secondary | ICD-10-CM

## 2020-10-05 DIAGNOSIS — Z01419 Encounter for gynecological examination (general) (routine) without abnormal findings: Secondary | ICD-10-CM

## 2020-10-05 DIAGNOSIS — Z136 Encounter for screening for cardiovascular disorders: Secondary | ICD-10-CM

## 2020-10-05 MED ORDER — FLUCONAZOLE 150 MG PO TABS: 150.0000 mg | ORAL_TABLET | Freq: Every day | ORAL | 0 refills | Status: DC

## 2020-10-05 NOTE — Progress Notes (Signed)
Subjective:    Patient ID: Taylor Hicks, female    DOB: 1985/10/29, 35 y.o.   MRN: [id number]  Patient presents today for CPE and eval of vaginitis  HPI  Sexual History (orientation,birth control, marital status, STD):has upcoming appt with GYN, declined breast exam, agreed to pelvic exam due to recurrent vaginitis (worse after menstrual cycle, minimal improvement with monistat and boric acid), denies need for STD screen. No contraception, husband is paraplegic  Depression/Suicide: Depression screen [facility] 2/9 10/05/2020 08/27/2020 07/23/2019 06/25/2018 06/21/2017  Decreased Interest 0 0 1 0 0  Down, Depressed, Hopeless 0 0 1 1 0  PHQ - 2 Score 0 0 2 1 0  Altered sleeping - - 1 0 0  Tired, decreased energy - - 1 1 1   Change in appetite - - 1 1 1   Feeling bad or failure about yourself  - - 1 0 1  Trouble concentrating - - 0 0 0  Moving slowly or fidgety/restless - - 0 0 0  Suicidal thoughts - - 0 0 0  PHQ-9 Score - - 6 3 3   Difficult doing work/chores - - Somewhat difficult Somewhat difficult -   GAD 7 : Generalized Anxiety Score 10/05/2020 07/23/2019 06/25/2018  Nervous, Anxious, on Edge 0 1 0  Control/stop worrying 0 1 0  Worry too much - different things 1 0 0  Trouble relaxing 0 0 0  Restless 0 0 0  Easily annoyed or irritable 0 1 1  Afraid - awful might happen 1 0 0  Total GAD 7 Score 2 3 1   Anxiety Difficulty Not difficult at all Not difficult at all Not difficult at all   Vision:up to date, use of corrective lens  Dental:up to date  Immunizations: (TDAP, Hep C screen, Pneumovax, Influenza, zoster)  Health Maintenance  Topic Date Due  . COVID-19 Vaccine (1) Never done  . Pap Smear  10/17/2020  . Flu Shot  11/19/2020*  .  Hepatitis C: One time screening is recommended by Center for Disease Control  (CDC) for  adults born from [birth year] through 1966.   10/05/2021*  . Tetanus Vaccine  02/20/2024  . HIV Screening  Completed  *Topic was postponed. The date shown is not the  original due date.   Diet:regular. Exercise: none Weight:  Wt Readings from Last 3 Encounters:  10/05/20 220 lb 12.8 oz (100.2 kg)  08/27/20 200 lb (90.7 kg)  07/23/19 206 lb 12.8 oz (93.8 kg)   Fall Risk: Fall Risk  10/05/2020  Falls in the past year? 0  Number falls in past yr: 0  Injury with Fall? 0  Risk for fall due to : No Fall Risks  Follow up Falls evaluation completed   Medications and allergies reviewed with patient and updated if appropriate.  Patient Active Problem List   Diagnosis Date Noted  . Endometriosis 07/23/2019  . Vitamin D deficiency 07/22/2019  . Allergic rhinitis 06/25/2018    Current Outpatient Medications on File Prior to Visit  Medication Sig Dispense Refill  . AUROVELA FE 1/20 1-20 MG-MCG tablet Take 1 tablet by mouth daily.    14/08/2018 azelastine (ASTELIN) 0.1 % nasal spray INSTILL 2 SPRAY IN EACH NOSTRIL EVERY DAY AS DIRECTED 30 mL 2  . cetirizine (ZYRTEC) 10 MG tablet Take 10 mg by mouth daily as needed for allergies.     . fluticasone (FLONASE) 50 MCG/ACT nasal spray Place 2 sprays into both nostrils daily. 16 g 1  . ibuprofen (ADVIL) 800 MG  tablet Take 1 tablet (800 mg total) by mouth every 6 (six) hours as needed. 30 tablet 1  . valACYclovir (VALTREX) 1000 MG tablet SMARTSIG:1 Tablet(s) By Mouth Every 12 Hours     No current facility-administered medications on file prior to visit.    Past Medical History:  Diagnosis Date  . Allergy   . Chronic kidney disease 2016   UTI TURNED INTO KIDNEY INFECTION  . Family history of adverse reaction to anesthesia    GRANDMOTHER HAD HARD TIME WAKING UP FROM ANESTHESIA  . Headache    OCCAS MIGRAINES  . History of chicken pox    Past Surgical History:  Procedure Laterality Date  . CESAREAN SECTION N/A 05/05/2014   Procedure: CESAREAN SECTION;  Surgeon: Leslie Andrea, MD;  Location: [facility];  Service: Obstetrics;  Laterality: N/A;  . EYE SURGERY  1996  . EYE SURGERY    . WOUND EXPLORATION N/A  10/25/2017   Procedure: REMOVAL OF INCISIONAL MASS;  Surgeon: Mitchel Honour, DO;  Location: [facility];  Service: Gynecology;  Laterality: N/A;    Social History   Socioeconomic History  . Marital status: Married    Spouse name: Not on file  . Number of children: Not on file  . Years of education: Not on file  . Highest education level: Not on file  Occupational History  . Not on file  Tobacco Use  . Smoking status: Never Smoker  . Smokeless tobacco: Never Used  Vaping Use  . Vaping Use: Never used  Substance and Sexual Activity  . Alcohol use: Yes    Alcohol/week: 0.0 standard drinks    Comment: social   . Drug use: No  . Sexual activity: Yes    Birth control/protection: Inserts  Other Topics Concern  . 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 Diabetes Father   . Hypertension Father   . Hyperlipidemia Mother   . Diabetes Paternal Grandmother   . Diabetes Paternal Grandfather   . COPD Maternal Grandmother   . Osteoporosis Maternal Grandmother   . Cancer Maternal Grandfather        lung       Review of Systems  Constitutional: Negative for fever, malaise/fatigue and weight loss.  HENT: Negative for congestion and sore throat.   Eyes:       Negative for visual changes  Respiratory: Negative for cough and shortness of breath.   Cardiovascular: Negative for chest pain, palpitations and leg swelling.  Gastrointestinal: Negative for blood in stool, constipation, diarrhea and heartburn.   Genitourinary: Negative for dysuria, flank pain, frequency, hematuria and urgency.  Musculoskeletal: Negative for falls, joint pain and myalgias.  Skin: Negative for rash.  Neurological: Negative for dizziness, sensory change and headaches.  Endo/Heme/Allergies: Does not bruise/bleed easily.  Psychiatric/Behavioral: Negative for depression, substance abuse and suicidal ideas. The patient is not nervous/anxious.    Objective:   Vitals:   10/05/20 1301  BP:  120/70  Pulse: 74  Temp: 98.1 F (36.7 C)  SpO2: 98%   Body mass index is 35.64 kg/m.  Physical Examination:  Physical Exam Vitals reviewed. Exam conducted with a chaperone present.  Constitutional:      General: She is not in acute distress.    Appearance: She is obese.  HENT:     Right Ear: Tympanic membrane, ear canal and external ear normal.     Left Ear: Tympanic membrane, ear canal and external ear normal.     Mouth/Throat:     Mouth: Oropharynx is clear and moist.  Eyes:     General: No scleral icterus.    Extraocular Movements: Extraocular movements intact and EOM normal.     Conjunctiva/sclera: Conjunctivae normal.  Neck:     Thyroid: No thyromegaly.  Cardiovascular:     Rate and Rhythm: Normal rate.     Pulses: Normal pulses and intact distal pulses.     Heart sounds: Normal heart sounds.  Pulmonary:     Effort: Pulmonary effort is normal.     Breath sounds: Normal breath sounds.  Chest:     Chest wall: No tenderness.  Abdominal:     General: Bowel sounds are normal. There is no distension.     Palpations: Abdomen is soft.     Tenderness: There is no abdominal tenderness.  Genitourinary:    General: Normal vulva.     Labia:        Right: No rash, tenderness or lesion.        Left: No rash, tenderness or lesion.      Vagina: No signs of injury and foreign body. Vaginal discharge and bleeding present. No erythema, tenderness or lesions.     Cervix: Discharge and cervical bleeding present. No  cervical motion tenderness, friability, lesion or erythema.     Adnexa: Right adnexa normal and left adnexa normal.  Musculoskeletal:        General: No tenderness or edema. Normal range of motion.     Cervical back: Normal range of motion and neck supple.  Lymphadenopathy:     Cervical: No cervical adenopathy.     Lower Body: No right inguinal adenopathy. No left inguinal adenopathy.  Skin:    General: Skin is warm and dry.  Neurological:     Mental Status: She is alert and oriented to person, place, and time.  Psychiatric:        Mood and Affect: Mood normal.        Behavior: Behavior normal.        Thought Content: Thought content normal.        Judgment: Judgment normal.    ASSESSMENT and PLAN: This visit occurred during the SARS-CoV-2 public health emergency.  Safety protocols were in place, including screening questions prior to the visit, additional usage of staff PPE, and extensive cleaning of exam room while observing appropriate contact time as indicated for disinfecting solutions.   Florentina AddisonKatie was seen today for establish care.  Diagnoses and all orders for this visit:  Well woman exam with routine gynecological exam -     CBC with Differential/Platelet; Future -     Comprehensive metabolic panel; Future -     Lipid panel; Future  Encounter for lipid screening for cardiovascular disease -     Lipid panel; Future  Vitamin D deficiency -     Vitamin D 1,25 dihydroxy; Future  Recurrent vaginitis -     Cervicovaginal ancillary only( [place]) -  fluconazole (DIFLUCAN) 150 MG tablet; Take 1 tablet (150 mg total) by mouth daily. Take second tab 3days apart from first tab      Problem List Items Addressed This Visit      Other   Vitamin D deficiency   Relevant Orders   Vitamin D 1,25 dihydroxy    Other Visit Diagnoses    Well woman exam with routine gynecological exam    -  Primary   Relevant Orders   CBC with Differential/Platelet   Comprehensive metabolic  panel   Lipid panel   Encounter for lipid screening for cardiovascular disease       Relevant Orders   Lipid panel   Recurrent vaginitis       Relevant Medications   fluconazole (DIFLUCAN) 150 MG tablet   Other Relevant Orders   Cervicovaginal ancillary only( [place])      Follow up: Return in about 1 year (around 10/05/2021) for CPE (fasting).  Alysia Penna, NP

## 2020-10-05 NOTE — Patient Instructions (Addendum)
Schedule lab appt for fasting blood draw. need to be fasting 6-8hrs prior to blood draw.  Start DASH diet and daily exercise.  Have repeat PAP smear results faxed to me.  Preventive Care 37-36 Years Old, Female Preventive care refers to lifestyle choices and visits with your health care provider that can promote health and wellness. This includes:  A yearly physical exam. This is also called an annual wellness visit.  Regular dental and eye exams.  Immunizations.  Screening for certain conditions.  Healthy lifestyle choices, such as: ? Eating a healthy diet. ? Getting regular exercise. ? Not using drugs or products that contain nicotine and tobacco. ? Limiting alcohol use. What can I expect for my preventive care visit? Physical exam Your health care provider may check your:  Height and weight. These may be used to calculate your BMI (body mass index). BMI is a measurement that tells if you are at a healthy weight.  Heart rate and blood pressure.  Body temperature.  Skin for abnormal spots. Counseling Your health care provider may ask you questions about your:  Past medical problems.  Family's medical history.  Alcohol, tobacco, and drug use.  Emotional well-being.  Home life and relationship well-being.  Sexual activity.  Diet, exercise, and sleep habits.  Work and work Statistician.  Access to firearms.  Method of birth control.  Menstrual cycle.  Pregnancy history. What immunizations do I need? Vaccines are usually given at various ages, according to a schedule. Your health care provider will recommend vaccines for you based on your age, medical history, and lifestyle or other factors, such as travel or where you work.   What tests do I need? Blood tests  Lipid and cholesterol levels. These may be checked every 5 years starting at age 62.  Hepatitis C test.  Hepatitis B test. Screening  Diabetes screening. This is done by checking your blood  sugar (glucose) after you have not eaten for a while (fasting).  STD (sexually transmitted disease) testing, if you are at risk.  BRCA-related cancer screening. This may be done if you have a family history of breast, ovarian, tubal, or peritoneal cancers.  Pelvic exam and Pap test. This may be done every 3 years starting at age 32. Starting at age 60, this may be done every 5 years if you have a Pap test in combination with an HPV test. Talk with your health care provider about your test results, treatment options, and if necessary, the need for more tests.   Follow these instructions at home: Eating and drinking  Eat a healthy diet that includes fresh fruits and vegetables, whole grains, lean protein, and low-fat dairy products.  Take vitamin and mineral supplements as recommended by your health care provider.  Do not drink alcohol if: ? Your health care provider tells you not to drink. ? You are pregnant, may be pregnant, or are planning to become pregnant.  If you drink alcohol: ? Limit how much you have to 0-1 drink a day. ? Be aware of how much alcohol is in your drink. In the [place] one drink equals one 12 oz bottle of beer (355 mL), one 5 oz glass of wine (148 mL), or one 1 oz glass of hard liquor (44 mL).   Lifestyle  Take daily care of your teeth and gums. Brush your teeth every morning and night with fluoride toothpaste. Floss one time each day.  Stay active. Exercise for at least 30 minutes 5 or more days  each week.  Do not use any products that contain nicotine or tobacco, such as cigarettes, e-cigarettes, and chewing tobacco. If you need help quitting, ask your health care provider.  Do not use drugs.  If you are sexually active, practice safe sex. Use a condom or other form of protection to prevent STIs (sexually transmitted infections).  If you do not wish to become pregnant, use a form of birth control. If you plan to become pregnant, see your health care provider  for a prepregnancy visit.  Find healthy ways to cope with stress, such as: ? Meditation, yoga, or listening to music. ? Journaling. ? Talking to a trusted person. ? Spending time with friends and family. Safety  Always wear your seat belt while driving or riding in a vehicle.  Do not drive: ? If you have been drinking alcohol. Do not ride with someone who has been drinking. ? When you are tired or distracted. ? While texting.  Wear a helmet and other protective equipment during sports activities.  If you have firearms in your house, make sure you follow all gun safety procedures.  Seek help if you have been physically or sexually abused. What's next?  Go to your health care provider once a year for an annual wellness visit.  Ask your health care provider how often you should have your eyes and teeth checked.  Stay up to date on all vaccines. This information is not intended to replace advice given to you by your health care provider. Make sure you discuss any questions you have with your health care provider. Document Revised: 04/05/2020 Document Reviewed: 04/19/2018 Elsevier Patient Education  2021 [employer].

## 2020-10-06 ENCOUNTER — Encounter: Payer: Self-pay | Admitting: Nurse Practitioner

## 2020-10-06 LAB — CERVICOVAGINAL ANCILLARY ONLY
Bacterial Vaginitis (gardnerella): NEGATIVE
Candida Glabrata: NEGATIVE
Candida Vaginitis: NEGATIVE
Comment: NEGATIVE
Comment: NEGATIVE
Comment: NEGATIVE

## 2020-10-07 ENCOUNTER — Other Ambulatory Visit: Payer: 59

## 2020-10-09 ENCOUNTER — Other Ambulatory Visit: Payer: Self-pay

## 2020-10-09 ENCOUNTER — Other Ambulatory Visit (INDEPENDENT_AMBULATORY_CARE_PROVIDER_SITE_OTHER): Payer: 59

## 2020-10-09 DIAGNOSIS — E559 Vitamin D deficiency, unspecified: Secondary | ICD-10-CM

## 2020-10-09 DIAGNOSIS — Z13220 Encounter for screening for lipoid disorders: Secondary | ICD-10-CM | POA: Diagnosis not present

## 2020-10-09 DIAGNOSIS — Z01419 Encounter for gynecological examination (general) (routine) without abnormal findings: Secondary | ICD-10-CM

## 2020-10-09 DIAGNOSIS — Z136 Encounter for screening for cardiovascular disorders: Secondary | ICD-10-CM

## 2020-10-09 DIAGNOSIS — Z1152 Encounter for screening for COVID-19: Secondary | ICD-10-CM

## 2020-10-09 LAB — CBC WITH DIFFERENTIAL/PLATELET
Basophils Absolute: 0 10*3/uL (ref 0.0–0.1)
Basophils Relative: 1.1 % (ref 0.0–3.0)
Eosinophils Absolute: 0.1 10*3/uL (ref 0.0–0.7)
Eosinophils Relative: 2.6 % (ref 0.0–5.0)
HCT: 41.7 % (ref 36.0–46.0)
Hemoglobin: 13.6 g/dL (ref 12.0–15.0)
Lymphocytes Relative: 39.3 % (ref 12.0–46.0)
Lymphs Abs: 1.5 10*3/uL (ref 0.7–4.0)
MCHC: 32.6 g/dL (ref 30.0–36.0)
MCV: 86.9 fl (ref 78.0–100.0)
Monocytes Absolute: 0.3 10*3/uL (ref 0.1–1.0)
Monocytes Relative: 7.8 % (ref 3.0–12.0)
Neutro Abs: 1.9 10*3/uL (ref 1.4–7.7)
Neutrophils Relative %: 49.2 % (ref 43.0–77.0)
Platelets: 154 10*3/uL (ref 150.0–400.0)
RBC: 4.8 Mil/uL (ref 3.87–5.11)
RDW: 13 % (ref 11.5–15.5)
WBC: 3.9 10*3/uL — ABNORMAL LOW (ref 4.0–10.5)

## 2020-10-09 LAB — COMPREHENSIVE METABOLIC PANEL
ALT: 24 U/L (ref 0–35)
AST: 21 U/L (ref 0–37)
Albumin: 4.2 g/dL (ref 3.5–5.2)
Alkaline Phosphatase: 68 U/L (ref 39–117)
BUN: 13 mg/dL (ref 6–23)
CO2: 31 mEq/L (ref 19–32)
Calcium: 9 mg/dL (ref 8.4–10.5)
Chloride: 102 mEq/L (ref 96–112)
Creatinine, Ser: 0.82 mg/dL (ref 0.40–1.20)
GFR: 92.67 mL/min (ref >60.00)
Glucose, Bld: 82 mg/dL (ref 70–99)
Potassium: 4.5 mEq/L (ref 3.5–5.1)
Sodium: 137 mEq/L (ref 135–145)
Total Bilirubin: 0.7 mg/dL (ref 0.2–1.2)
Total Protein: 7.1 g/dL (ref 6.0–8.3)

## 2020-10-09 LAB — LIPID PANEL
Cholesterol: 173 mg/dL (ref 0–200)
HDL: 51.6 mg/dL (ref >39.00)
LDL Cholesterol: 101 mg/dL — ABNORMAL HIGH (ref 0–99)
NonHDL: 121.55
Total CHOL/HDL Ratio: 3
Triglycerides: 105 mg/dL (ref 0.0–149.0)
VLDL: 21 mg/dL (ref 0.0–40.0)

## 2020-10-09 NOTE — Progress Notes (Signed)
Per orders of NP [facility], pt is here for labs, pt tolerated Lab draw well

## 2020-10-09 NOTE — Addendum Note (Signed)
Addended by: Varney Biles on: 10/09/2020 11:08 AM   Modules accepted: Orders

## 2020-10-12 LAB — SARS COV-2 SEROLOGY(COVID-19)AB(IGG,IGM),IMMUNOASSAY
SARS CoV-2 AB IgG: POSITIVE — AB
SARS CoV-2 IgM: POSITIVE — AB

## 2020-10-14 LAB — VITAMIN D 1,25 DIHYDROXY
Vitamin D 1, 25 (OH)2 Total: 44 pg/mL (ref 18–72)
Vitamin D2 1, 25 (OH)2: 13 pg/mL
Vitamin D3 1, 25 (OH)2: 31 pg/mL

## 2020-12-16 LAB — HM PAP SMEAR

## 2021-05-27 ENCOUNTER — Other Ambulatory Visit: Payer: Self-pay

## 2021-05-27 ENCOUNTER — Ambulatory Visit: Payer: 59 | Admitting: Family Medicine

## 2021-05-27 VITALS — BP 118/66 | HR 66 | Temp 97.9°F | Ht 66.0 in | Wt 232.6 lb

## 2021-05-27 DIAGNOSIS — H60391 Other infective otitis externa, right ear: Secondary | ICD-10-CM

## 2021-05-27 NOTE — Patient Instructions (Signed)
Apply moist heat to earlobe for 5-10 minutes 2-3 times a day.

## 2021-05-27 NOTE — Progress Notes (Signed)
[facility] [address] [place] [address] Dept: [phone] Dept Fax: [phone]  Office Visit  Subjective:    Patient ID: Taylor Hicks, female    DOB: 03-Sep-1985, 35 y.o..   MRN: [id number]  Chief Complaint  Patient presents with   Acute Visit    C/o having a bump on RT ear x 1 week .   Declines flu shot.     History of Present Illness:  Patient is in today for evaluation of her right ear lobe. She notes that last Friday, she developed a "pimple" on the back of her right earlobe. She squeezed the lesion. Within a day, she had more prominent swelling and pain in the earlobe. She has been avoiding using her ear rings. She notes this was painful enough, it was interfering with sleep. At one point, she did try lancing this with a needle. She has seen the swelling subside quite a bit and the pain is improving.  Past Medical History: Patient Active Problem List   Diagnosis Date Noted   Endometriosis 07/23/2019   Vitamin D deficiency 07/22/2019   Allergic rhinitis 06/25/2018   Past Surgical History:  Procedure Laterality Date   CESAREAN SECTION N/A 05/05/2014   Procedure: CESAREAN SECTION;  Surgeon: Leslie Andrea, MD;  Location: [facility];  Service: Obstetrics;  Laterality: N/A;   EYE SURGERY  1996   EYE SURGERY     WOUND EXPLORATION N/A 10/25/2017   Procedure: REMOVAL OF INCISIONAL MASS;  Surgeon: Mitchel Honour, DO;  Location: [facility];  Service: Gynecology;  Laterality: N/A;   Family History  Problem Relation Age of Onset   Diabetes Father    Hypertension Father    Hyperlipidemia Mother    Diabetes Paternal Grandmother    Diabetes Paternal Grandfather    COPD Maternal Grandmother    Osteoporosis Maternal Grandmother    Cancer Maternal Grandfather        lung   Outpatient Medications Prior to Visit  Medication Sig Dispense Refill   azelastine (ASTELIN) 0.1 % nasal spray INSTILL 2 SPRAY IN EACH NOSTRIL EVERY DAY  AS DIRECTED 30 mL 2   Bacillus Coagulans-Inulin (PROBIOTIC) 1-250 BILLION-MG CAPS Probiotic     cetirizine (ZYRTEC) 10 MG tablet Take 10 mg by mouth daily as needed for allergies.      fluticasone (FLONASE) 50 MCG/ACT nasal spray Place 2 sprays into both nostrils daily. 16 g 1   valACYclovir (VALTREX) 1000 MG tablet SMARTSIG:1 Tablet(s) By Mouth Every 12 Hours     AUROVELA FE 1/20 1-20 MG-MCG tablet Take 1 tablet by mouth daily. (Patient not taking: Reported on 05/27/2021)     fluconazole (DIFLUCAN) 150 MG tablet Take 1 tablet (150 mg total) by mouth daily. Take second tab 3days apart from first tab (Patient not taking: Reported on 05/27/2021) 2 tablet 0   ibuprofen (ADVIL) 800 MG tablet Take 1 tablet (800 mg total) by mouth every 6 (six) hours as needed. (Patient not taking: Reported on 05/27/2021) 30 tablet 1   No facility-administered medications prior to visit.   Allergies  Allergen Reactions   Benzoyl Peroxide Itching   Azithromycin Hives     Objective:   Today's Vitals   05/27/21 0813  BP: 118/66  Pulse: 66  Temp: 97.9 F (36.6 C)  TempSrc: Temporal  SpO2: 98%  Weight: 232 lb 9.6 oz (105.5 kg)  Height: 5\' 6"  (1.676 m)   Body mass index is 37.54 kg/m.  General: Well developed, well nourished. No acute distress. Ear: The right earlobe is mildly thickened and has some redness to the posterior aspect with a small   healing lesion. No fluctuance noted. Psych: Alert and oriented. Normal mood and affect.  Health Maintenance Due  Topic Date Due   COVID-19 Vaccine (1) Never done   PAP SMEAR-Modifier  10/17/2020   INFLUENZA VACCINE  03/22/2021     Assessment & Plan:   1. Skin of right earlobe with infection Likely this began as a pimple that then spread locally after she squeezed it. At this point, the infection appears to be resolving. I recommend she apply moist heat and monitor for improvement. If this does worsen over the next few days, she should reach out to me to  consider antibiotics.  Loyola Mast, MD

## 2021-07-19 ENCOUNTER — Encounter: Payer: Self-pay | Admitting: Family Medicine

## 2021-07-19 ENCOUNTER — Telehealth: Payer: 59 | Admitting: Family Medicine

## 2021-07-19 DIAGNOSIS — R6889 Other general symptoms and signs: Secondary | ICD-10-CM | POA: Diagnosis not present

## 2021-07-19 MED ORDER — BENZONATATE 100 MG PO CAPS: 100.0000 mg | ORAL_CAPSULE | Freq: Two times a day (BID) | ORAL | 0 refills | Status: DC | PRN

## 2021-07-19 MED ORDER — OSELTAMIVIR PHOSPHATE 75 MG PO CAPS: 75.0000 mg | ORAL_CAPSULE | Freq: Two times a day (BID) | ORAL | 0 refills | Status: AC

## 2021-07-19 NOTE — Progress Notes (Addendum)
Virtual Visit Consent   Taylor Hicks, you are scheduled for a virtual visit with a [facility] provider today.     Just as with appointments in the office, your consent must be obtained to participate.  Your consent will be active for this visit and any virtual visit you may have with one of our providers in the next 365 days.     If you have a MyChart account, a copy of this consent can be sent to you electronically.  All virtual visits are billed to your insurance company just like a traditional visit in the office.    As this is a virtual visit, video technology does not allow for your provider to perform a traditional examination.  This may limit your provider's ability to fully assess your condition.  If your provider identifies any concerns that need to be evaluated in person or the need to arrange testing (such as labs, EKG, etc.), we will make arrangements to do so.     Although advances in technology are sophisticated, we cannot ensure that it will always work on either your end or our end.  If the connection with a video visit is poor, the visit may have to be switched to a telephone visit.  With either a video or telephone visit, we are not always able to ensure that we have a secure connection.     I need to obtain your verbal consent now.   Are you willing to proceed with your visit today?    Taylor Hicks has provided verbal consent on 07/19/2021 for a virtual visit (video or telephone).   Freddy Finner, NP   Date: 07/19/2021 12:40 PM   Virtual Visit via Video Note   I, Freddy Finner, connected with  Taylor Hicks  ([id number], 1986/03/29) on 07/19/21 at 12:15 PM EST by a video-enabled telemedicine application and verified that I am speaking with the correct person using two identifiers.  Location: Patient: Virtual Visit Location Patient: Home Provider: Virtual Visit Location Provider: Home Office   I discussed the limitations of evaluation and management by telemedicine  and the availability of in person appointments. The patient expressed understanding and agreed to proceed.    History of Present Illness: Taylor Hicks is a 35 y.o. who identifies as a female who was assigned female at birth, and is being seen today for increased flu like symptoms. Started this morning and as day went on she felt like she was hit by truck. She is worried as her husband has a spinal cord injury and she is his main caregiver and does not want to make him sick. She reports cough that is strong and not productive at this time, mild aches starting. Denies fevers,chills, shortness of breath, chest pain, or ear pain or sore throat pain. Known sick contacts with son who has similar symptoms post kids in his classroom at school having flu.   Problems:  Patient Active Problem List   Diagnosis Date Noted   Endometriosis 07/23/2019   Vitamin D deficiency 07/22/2019   Allergic rhinitis 06/25/2018    Allergies:  Allergies  Allergen Reactions   Benzoyl Peroxide Itching   Azithromycin Hives   Medications:  Current Outpatient Medications:    azelastine (ASTELIN) 0.1 % nasal spray, INSTILL 2 SPRAY IN EACH NOSTRIL EVERY DAY AS DIRECTED, Disp: 30 mL, Rfl: 2   Bacillus Coagulans-Inulin (PROBIOTIC) 1-250 BILLION-MG CAPS, Probiotic, Disp: , Rfl:    cetirizine (ZYRTEC) 10 MG tablet,  Take 10 mg by mouth daily as needed for allergies. , Disp: , Rfl:    fluticasone (FLONASE) 50 MCG/ACT nasal spray, Place 2 sprays into both nostrils daily., Disp: 16 g, Rfl: 1   valACYclovir (VALTREX) 1000 MG tablet, SMARTSIG:1 Tablet(s) By Mouth Every 12 Hours, Disp: , Rfl:   Observations/Objective: Patient is well-developed, well-nourished in no acute distress.  Resting comfortably  at home.  Head is normocephalic, atraumatic.  No labored breathing. Speech is clear and coherent with logical content.  Patient is alert and oriented at baseline.  Cough- mild and noted  Assessment and Plan: 1. Flu-like  symptoms S&S consistent with flu onset Husband is compromised and desire for tamiflu is present will provided Perles for cough as well OTC measures reviewed   - oseltamivir (TAMIFLU) 75 MG capsule; Take 1 capsule (75 mg total) by mouth 2 (two) times daily for 5 days.  Dispense: 10 capsule; Refill: 0 - benzonatate (TESSALON) 100 MG capsule; Take 1 capsule (100 mg total) by mouth 2 (two) times daily as needed for cough.  Dispense: 20 capsule; Refill: 0    Follow Up Instructions: I discussed the assessment and treatment plan with the patient. The patient was provided an opportunity to ask questions and all were answered. The patient agreed with the plan and demonstrated an understanding of the instructions.  A copy of instructions were sent to the patient via MyChart unless otherwise noted below.     The patient was advised to call back or seek an in-person evaluation if the symptoms worsen or if the condition fails to improve as anticipated.  Time:  I spent 10 minutes with the patient via telehealth technology discussing the above problems/concerns.    Freddy Finner, NP

## 2021-07-19 NOTE — Patient Instructions (Signed)
Please take medication as directed.  I hope you feel better!  Happy Holidays

## 2021-07-19 NOTE — Addendum Note (Signed)
Addended by: Freddy Finner on: 07/19/2021 01:01 PM   Modules accepted: Orders, Level of Service

## 2021-07-26 ENCOUNTER — Encounter: Payer: Self-pay | Admitting: Family Medicine

## 2021-07-26 MED ORDER — ALBUTEROL SULFATE HFA 108 (90 BASE) MCG/ACT IN AERS
2.0000 | INHALATION_SPRAY | Freq: Four times a day (QID) | RESPIRATORY_TRACT | 0 refills | Status: DC | PRN
Start: 2021-07-26 — End: 2023-01-04

## 2021-07-26 NOTE — Addendum Note (Signed)
Addended by: Margaretann Loveless on: 07/26/2021 03:05 PM   Modules accepted: Orders

## 2021-10-06 ENCOUNTER — Other Ambulatory Visit: Payer: Self-pay

## 2021-10-07 ENCOUNTER — Ambulatory Visit (INDEPENDENT_AMBULATORY_CARE_PROVIDER_SITE_OTHER): Payer: 59 | Admitting: Nurse Practitioner

## 2021-10-07 ENCOUNTER — Encounter: Payer: Self-pay | Admitting: Nurse Practitioner

## 2021-10-07 VITALS — BP 118/72 | HR 70 | Temp 98.1°F | Ht 66.25 in | Wt 233.0 lb

## 2021-10-07 DIAGNOSIS — Z136 Encounter for screening for cardiovascular disorders: Secondary | ICD-10-CM | POA: Diagnosis not present

## 2021-10-07 DIAGNOSIS — Z13220 Encounter for screening for lipoid disorders: Secondary | ICD-10-CM | POA: Diagnosis not present

## 2021-10-07 DIAGNOSIS — H66002 Acute suppurative otitis media without spontaneous rupture of ear drum, left ear: Secondary | ICD-10-CM | POA: Diagnosis not present

## 2021-10-07 DIAGNOSIS — J0101 Acute recurrent maxillary sinusitis: Secondary | ICD-10-CM

## 2021-10-07 DIAGNOSIS — D72819 Decreased white blood cell count, unspecified: Secondary | ICD-10-CM | POA: Diagnosis not present

## 2021-10-07 DIAGNOSIS — Z6837 Body mass index (BMI) 37.0-37.9, adult: Secondary | ICD-10-CM | POA: Diagnosis not present

## 2021-10-07 DIAGNOSIS — E559 Vitamin D deficiency, unspecified: Secondary | ICD-10-CM

## 2021-10-07 DIAGNOSIS — Z0001 Encounter for general adult medical examination with abnormal findings: Secondary | ICD-10-CM

## 2021-10-07 DIAGNOSIS — R051 Acute cough: Secondary | ICD-10-CM

## 2021-10-07 LAB — CBC WITH DIFFERENTIAL/PLATELET
Basophils Absolute: 0 10*3/uL (ref 0.0–0.1)
Basophils Relative: 0.5 % (ref 0.0–3.0)
Eosinophils Absolute: 0.1 10*3/uL (ref 0.0–0.7)
Eosinophils Relative: 2.2 % (ref 0.0–5.0)
HCT: 39.6 % (ref 36.0–46.0)
Hemoglobin: 12.8 g/dL (ref 12.0–15.0)
Lymphocytes Relative: 30.9 % (ref 12.0–46.0)
Lymphs Abs: 1.5 10*3/uL (ref 0.7–4.0)
MCHC: 32.4 g/dL (ref 30.0–36.0)
MCV: 84 fl (ref 78.0–100.0)
Monocytes Absolute: 0.4 10*3/uL (ref 0.1–1.0)
Monocytes Relative: 8 % (ref 3.0–12.0)
Neutro Abs: 2.9 10*3/uL (ref 1.4–7.7)
Neutrophils Relative %: 58.4 % (ref 43.0–77.0)
Platelets: 143 10*3/uL — ABNORMAL LOW (ref 150.0–400.0)
RBC: 4.71 Mil/uL (ref 3.87–5.11)
RDW: 13.7 % (ref 11.5–15.5)
WBC: 5 10*3/uL (ref 4.0–10.5)

## 2021-10-07 LAB — IBC + FERRITIN
Ferritin: 27.6 ng/mL (ref 10.0–291.0)
Iron: 59 ug/dL (ref 42–145)
Saturation Ratios: 17 % — ABNORMAL LOW (ref 20.0–50.0)
TIBC: 347.2 ug/dL (ref 250.0–450.0)
Transferrin: 248 mg/dL (ref 212.0–360.0)

## 2021-10-07 LAB — LIPID PANEL
Cholesterol: 152 mg/dL (ref 0–200)
HDL: 42.6 mg/dL (ref >39.00)
LDL Cholesterol: 87 mg/dL (ref 0–99)
NonHDL: 109.16
Total CHOL/HDL Ratio: 4
Triglycerides: 110 mg/dL (ref 0.0–149.0)
VLDL: 22 mg/dL (ref 0.0–40.0)

## 2021-10-07 LAB — COMPREHENSIVE METABOLIC PANEL
ALT: 16 U/L (ref 0–35)
AST: 16 U/L (ref 0–37)
Albumin: 4.2 g/dL (ref 3.5–5.2)
Alkaline Phosphatase: 70 U/L (ref 39–117)
BUN: 13 mg/dL (ref 6–23)
CO2: 32 mEq/L (ref 19–32)
Calcium: 9 mg/dL (ref 8.4–10.5)
Chloride: 104 mEq/L (ref 96–112)
Creatinine, Ser: 0.8 mg/dL (ref 0.40–1.20)
GFR: 94.79 mL/min (ref >60.00)
Glucose, Bld: 86 mg/dL (ref 70–99)
Potassium: 4.5 mEq/L (ref 3.5–5.1)
Sodium: 137 mEq/L (ref 135–145)
Total Bilirubin: 1 mg/dL (ref 0.2–1.2)
Total Protein: 7.1 g/dL (ref 6.0–8.3)

## 2021-10-07 LAB — TSH: TSH: 0.75 u[IU]/mL (ref 0.35–5.50)

## 2021-10-07 LAB — HEMOGLOBIN A1C: Hgb A1c MFr Bld: 5.3 % (ref 4.6–6.5)

## 2021-10-07 LAB — VITAMIN D 25 HYDROXY (VIT D DEFICIENCY, FRACTURES): VITD: 18.64 ng/mL — ABNORMAL LOW (ref 30.00–100.00)

## 2021-10-07 MED ORDER — AMOXICILLIN-POT CLAVULANATE 875-125 MG PO TABS: 1.0000 | ORAL_TABLET | Freq: Two times a day (BID) | ORAL | 0 refills | Status: DC

## 2021-10-07 NOTE — Progress Notes (Signed)
Subjective:    Patient ID: Taylor Hicks, female    DOB: [birth year], 36 y.o.   MRN: [id number]  Patient presents today for CPE and eval of acute conditions  Sinus Problem This is a new problem. The current episode started 1 to 4 weeks ago. The problem is unchanged. There has been no fever. The pain is moderate. Associated symptoms include congestion, ear pain, a hoarse voice and sinus pressure. Pertinent negatives include no chills, coughing, diaphoresis, headaches, neck pain, shortness of breath, sneezing, sore throat or swollen glands. Past treatments include nothing.   Vision:up to date Dental:up to date Diet:regular Exercise:orange theory 2-3x/week, 1hr each Weight:  Wt Readings from Last 3 Encounters:  10/07/21 233 lb (105.7 kg)  05/27/21 232 lb 9.6 oz (105.5 kg)  10/05/20 220 lb 12.8 oz (100.2 kg)    Sexual History (orientation,birth control, marital status, STD):GYN (Dr. Langston Masker) [facility], up to date with PAP  Depression/Suicide: Depression screen [facility] 2/9 10/07/2021 10/05/2020 08/27/2020 07/23/2019 06/25/2018 06/21/2017  Decreased Interest 0 0 0 1 0 0  Down, Depressed, Hopeless 0 0 0 1 1 0  PHQ - 2 Score 0 0 0 2 1 0  Altered sleeping - - - 1 0 0  Tired, decreased energy - - - 1 1 1   Change in appetite - - - 1 1 1   Feeling bad or failure about yourself  - - - 1 0 1  Trouble concentrating - - - 0 0 0  Moving slowly or fidgety/restless - - - 0 0 0  Suicidal thoughts - - - 0 0 0  PHQ-9 Score - - - 6 3 3   Difficult doing work/chores - - - Somewhat difficult Somewhat difficult -   Immunizations: (TDAP, Hep C screen, Pneumovax, Influenza, zoster)  Health Maintenance  Topic Date Due   COVID-19 Vaccine (1) Never done   Hepatitis C Screening: USPSTF Recommendation to screen - Ages 83-79 yo.  Never done   Pap Smear  10/17/2020   Flu Shot  11/19/2021*   Tetanus Vaccine  02/20/2024   HIV Screening  Completed   HPV Vaccine  Aged Out  *Topic was postponed. The date shown  is not the original due date.   Fall Risk: Fall Risk  10/05/2020  Falls in the past year? 0  Number falls in past yr: 0  Injury with Fall? 0  Risk for fall due to : No Fall Risks  Follow up Falls evaluation completed   Medications and allergies reviewed with patient and updated if appropriate.  Patient Active Problem List   Diagnosis Date Noted   Endometriosis 07/23/2019   Vitamin D deficiency 07/22/2019   Allergic rhinitis 06/25/2018    Current Outpatient Medications on File Prior to Visit  Medication Sig Dispense Refill   albuterol (VENTOLIN HFA) 108 (90 Base) MCG/ACT inhaler Inhale 2 puffs into the lungs every 6 (six) hours as needed for wheezing or shortness of breath. 8 g 0   azelastine (ASTELIN) 0.1 % nasal spray INSTILL 2 SPRAY IN EACH NOSTRIL EVERY DAY AS DIRECTED 30 mL 2   Bacillus Coagulans-Inulin (PROBIOTIC) 1-250 BILLION-MG CAPS Probiotic     benzonatate (TESSALON) 100 MG capsule Take 1 capsule (100 mg total) by mouth 2 (two) times daily as needed for cough. 20 capsule 0   cetirizine (ZYRTEC) 10 MG tablet Take 10 mg by mouth daily as needed for allergies.      fluticasone (FLONASE) 50 MCG/ACT nasal spray Place 2 sprays into both  nostrils daily. 16 g 1   valACYclovir (VALTREX) 1000 MG tablet SMARTSIG:1 Tablet(s) By Mouth Every 12 Hours     No current facility-administered medications on file prior to visit.   Past Medical History:  Diagnosis Date   Allergy    Chronic kidney disease 2016   UTI TURNED INTO KIDNEY INFECTION   Family history of adverse reaction to anesthesia    GRANDMOTHER HAD HARD TIME WAKING UP FROM ANESTHESIA   Headache    OCCAS MIGRAINES   History of chicken pox    Past Surgical History:  Procedure Laterality Date   CESAREAN SECTION N/A 05/05/2014   Procedure: CESAREAN SECTION;  Surgeon: Leslie Andrea, MD;  Location: [facility];  Service: Obstetrics;  Laterality: N/A;   EYE SURGERY  1996   EYE SURGERY     WOUND EXPLORATION N/A 10/25/2017    Procedure: REMOVAL OF INCISIONAL MASS;  Surgeon: Mitchel Honour, DO;  Location: [facility];  Service: Gynecology;  Laterality: N/A;   Social History   Socioeconomic History   Marital status: Married    Spouse name: Not on file   Number of children: Not on file   Years of education: Not on file   Highest education level: Not on file  Occupational History   Not on file  Tobacco Use   Smoking status: Never   Smokeless tobacco: Never  Vaping Use   Vaping Use: Never used  Substance and Sexual Activity   Alcohol use: Yes    Alcohol/week: 0.0 standard drinks    Comment: social    Drug use: No   Sexual activity: Yes    Birth control/protection: Inserts  Other Topics Concern   Not on file  Social History Narrative   Had baby Colon Branch born 05/05/14   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   Married   Enjoys to read, [place] has [place] that she enjoys, Multimedia programmer in Programme researcher, 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  HENT:  Positive for congestion, ear pain, hoarse voice and sinus pressure. Negative for ear discharge, hearing loss, sneezing, sore throat and tinnitus.   Eyes:        Negative for visual changes  Respiratory:  Negative for cough and shortness of breath.   Cardiovascular:  Negative for chest pain, palpitations and leg swelling.   Gastrointestinal:  Negative for blood in stool, constipation, diarrhea and heartburn.  Genitourinary:  Negative for dysuria, frequency and urgency.  Musculoskeletal:  Negative for falls, joint pain, myalgias and neck pain.  Skin:  Negative for rash.  Neurological:  Negative for dizziness, sensory change and headaches.  Endo/Heme/Allergies:  Does not bruise/bleed easily.  Psychiatric/Behavioral:  Negative for depression, substance abuse and suicidal ideas. The patient is not nervous/anxious.  Objective:   Vitals:   10/07/21 0930  BP: 118/72  Pulse: 70  Temp: 98.1 F (36.7 C)  SpO2: 99%    Body mass index is 37.32 kg/m.  Physical Examination:  Physical Exam Vitals reviewed.  Constitutional:      General: She is not in acute distress.    Appearance: She is well-developed. She is obese.  HENT:     Head:     Jaw: There is normal jaw occlusion.     Salivary Glands: Right salivary gland is not diffusely enlarged or tender. Left salivary gland is not diffusely enlarged or tender.     Right Ear: Tympanic membrane, ear canal and external ear normal.     Left Ear: Hearing, ear canal and external ear normal. No drainage. A middle ear effusion is present. No mastoid tenderness. Tympanic membrane is injected, erythematous and bulging. Tympanic membrane is not scarred or perforated.     Nose:     Right Sinus: Maxillary sinus tenderness present.     Left Sinus: Maxillary sinus tenderness present.     Mouth/Throat:     Pharynx: No oropharyngeal exudate.  Eyes:     Extraocular Movements: Extraocular movements intact.     Conjunctiva/sclera: Conjunctivae normal.     Pupils: Pupils are equal, round, and reactive to light.  Cardiovascular:     Rate and Rhythm: Normal rate and regular rhythm.     Heart sounds: Normal heart sounds.  Pulmonary:     Effort: Pulmonary effort is normal. No respiratory distress.     Breath sounds: Normal breath sounds.  Chest:     Chest wall: No  tenderness.  Abdominal:     General: Bowel sounds are normal.     Palpations: Abdomen is soft.  Genitourinary:    Comments: Deferred breast and pelvic exam to GYN Musculoskeletal:        General: Normal range of motion.     Cervical back: Normal range of motion and neck supple.     Right lower leg: No edema.     Left lower leg: No edema.  Lymphadenopathy:     Cervical: No cervical adenopathy.  Skin:    General: Skin is warm and dry.  Neurological:     Mental Status: She is alert and oriented to person, place, and time.     Deep Tendon Reflexes: Reflexes are normal and symmetric.  Psychiatric:        Mood and Affect: Mood normal.        Behavior: Behavior normal.        Thought Content: Thought content normal.   ASSESSMENT and PLAN: This visit occurred during the SARS-CoV-2 public health emergency.  Safety protocols were in place, including screening questions prior to the visit, additional usage of staff PPE, and extensive cleaning of exam room while observing appropriate contact time as indicated for disinfecting solutions.   Florentina AddisonKatie was seen today for annual exam.  Diagnoses and all orders for this visit:  Encounter for preventative adult health care exam with abnormal findings -     CBC with Differential/Platelet -     Comprehensive metabolic panel -     Lipid panel  Vitamin D deficiency -     Vitamin D (25 hydroxy)  Encounter for lipid screening for cardiovascular disease -     Lipid panel  Class 2 severe obesity due to excess calories with serious comorbidity and body mass index (BMI) of 37.0 to 37.9 in adult [facility](HCC) -  TSH -     Hemoglobin A1c  Leukopenia, unspecified type -     IBC + Ferritin  Non-recurrent acute suppurative otitis media of left ear without spontaneous rupture of tympanic membrane -     amoxicillin-clavulanate (AUGMENTIN) 875-125 MG tablet; Take 1 tablet by mouth 2 (two) times daily.  Acute recurrent maxillary sinusitis -      amoxicillin-clavulanate (AUGMENTIN) 875-125 MG tablet; Take 1 tablet by mouth 2 (two) times daily.  Hold azelastic spray Start zyrtec D 1tab daily x 5days, then stop Flonase and Afrin use: apply 1spray of afrin in each nare, wait , then apply 2sprays of flonase in each nare. Use both nasal spray consecutively x 3days, then flonase only for at least 14days. Sign medical release to get records from GYN.    Problem List Items Addressed This Visit       Other   Vitamin D deficiency   Relevant Orders   Vitamin D (25 hydroxy)   Other Visit Diagnoses     Encounter for preventative adult health care exam with abnormal findings    -  Primary   Relevant Orders   CBC with Differential/Platelet   Comprehensive metabolic panel   Lipid panel   Encounter for lipid screening for cardiovascular disease       Relevant Orders   Lipid panel   Class 2 severe obesity due to excess calories with serious comorbidity and body mass index (BMI) of 37.0 to 37.9 in adult [facility])       Relevant Orders   TSH   Hemoglobin A1c   Leukopenia, unspecified type       Relevant Orders   IBC + Ferritin   Non-recurrent acute suppurative otitis media of left ear without spontaneous rupture of tympanic membrane       Relevant Medications   amoxicillin-clavulanate (AUGMENTIN) 875-125 MG tablet   Acute recurrent maxillary sinusitis       Relevant Medications   amoxicillin-clavulanate (AUGMENTIN) 875-125 MG tablet       Follow up: Return in about 1 year (around 10/07/2022) for CPE (fasting).  Alysia Penna, NP

## 2021-10-07 NOTE — Patient Instructions (Signed)
[facility] Instructions: Hold azelastic spray Start zyrtec D 1tab daily x 5days, then stop Flonase and Afrin use: apply 1spray of afrin in each nare, wait 2mns, then apply 2sprays of flonase in each nare. Use both nasal spray consecutively x 3days, then flonase only for at least 14days.  Go to lab for blood draw.  Sign medical release to get records from GYN.  Preventive Care [age] Old, Female Preventive care refers to lifestyle choices and visits with your health care provider that can promote health and wellness. Preventive care visits are also called wellness exams. What can I expect for my preventive care visit? Counseling During your preventive care visit, your health care provider may ask about your: Medical history, including: Past medical problems. Family medical history. Pregnancy history. Current health, including: Menstrual cycle. Method of birth control. Emotional well-being. Home life and relationship well-being. Sexual activity and sexual health. Lifestyle, including: Alcohol, nicotine or tobacco, and drug use. Access to firearms. Diet, exercise, and sleep habits. Work and work eStatistician Sunscreen use. Safety issues such as seatbelt and bike helmet use. Physical exam Your health care provider may check your: Height and weight. These may be used to calculate your BMI (body mass index). BMI is a measurement that tells if you are at a healthy weight. Waist circumference. This measures the distance around your waistline. This measurement also tells if you are at a healthy weight and may help predict your risk of certain diseases, such as type 2 diabetes and high blood pressure. Heart rate and blood pressure. Body temperature. Skin for abnormal spots. What immunizations do I need? Vaccines are usually given at various ages, according to a schedule. Your health care provider will recommend vaccines for you based on your age, medical history, and lifestyle or other  factors, such as travel or where you work. What tests do I need? Screening Your health care provider may recommend screening tests for certain conditions. This may include: Pelvic exam and Pap test. Lipid and cholesterol levels. Diabetes screening. This is done by checking your blood sugar (glucose) after you have not eaten for a while (fasting). Hepatitis B test. Hepatitis C test. HIV (human immunodeficiency virus) test. STI (sexually transmitted infection) testing, if you are at risk. BRCA-related cancer screening. This may be done if you have a family history of breast, ovarian, tubal, or peritoneal cancers. Talk with your health care provider about your test results, treatment options, and if necessary, the need for more tests. Follow these instructions at home: Eating and drinking  Eat a healthy diet that includes fresh fruits and vegetables, whole grains, lean protein, and low-fat dairy products. Take vitamin and mineral supplements as recommended by your health care provider. Do not drink alcohol if: Your health care provider tells you not to drink. You are pregnant, may be pregnant, or are planning to become pregnant. If you drink alcohol: Limit how much you have to 0-1 drink a day. Know how much alcohol is in your drink. In the [place] one drink equals one 12 oz bottle of beer (355 mL), one 5 oz glass of wine (148 mL), or one 1 oz glass of hard liquor (44 mL). Lifestyle Brush your teeth every morning and night with fluoride toothpaste. Floss one time each day. Exercise for at least 30 minutes 5 or more days each week. Do not use any products that contain nicotine or tobacco. These products include cigarettes, chewing tobacco, and vaping devices, such as e-cigarettes. If you need help quitting, ask  your health care provider. Do not use drugs. If you are sexually active, practice safe sex. Use a condom or other form of protection to prevent STIs. If you do not wish to become  pregnant, use a form of birth control. If you plan to become pregnant, see your health care provider for a prepregnancy visit. Find healthy ways to manage stress, such as: Meditation, yoga, or listening to music. Journaling. Talking to a trusted person. Spending time with friends and family. Minimize exposure to UV radiation to reduce your risk of skin cancer. Safety Always wear your seat belt while driving or riding in a vehicle. Do not drive: If you have been drinking alcohol. Do not ride with someone who has been drinking. If you have been using any mind-altering substances or drugs. While texting. When you are tired or distracted. Wear a helmet and other protective equipment during sports activities. If you have firearms in your house, make sure you follow all gun safety procedures. Seek help if you have been physically or sexually abused. What's next? Go to your health care provider once a year for an annual wellness visit. Ask your health care provider how often you should have your eyes and teeth checked. Stay up to date on all vaccines. This information is not intended to replace advice given to you by your health care provider. Make sure you discuss any questions you have with your health care provider. Document Revised: 02/03/2021 Document Reviewed: 02/03/2021 Elsevier Patient Education  [place].

## 2021-10-13 MED ORDER — BENZONATATE 100 MG PO CAPS: 100.0000 mg | ORAL_CAPSULE | Freq: Two times a day (BID) | ORAL | 0 refills | Status: DC | PRN

## 2021-10-14 ENCOUNTER — Ambulatory Visit: Payer: 59 | Admitting: Family Medicine

## 2021-10-14 ENCOUNTER — Encounter: Payer: Self-pay | Admitting: Family Medicine

## 2021-10-14 ENCOUNTER — Other Ambulatory Visit: Payer: Self-pay

## 2021-10-14 VITALS — BP 122/70 | HR 76 | Temp 98.0°F | Ht 66.0 in | Wt 235.8 lb

## 2021-10-14 DIAGNOSIS — J069 Acute upper respiratory infection, unspecified: Secondary | ICD-10-CM | POA: Diagnosis not present

## 2021-10-14 DIAGNOSIS — H6983 Other specified disorders of Eustachian tube, bilateral: Secondary | ICD-10-CM | POA: Diagnosis not present

## 2021-10-14 MED ORDER — METHYLPREDNISOLONE SODIUM SUCC 125 MG IJ SOLR
125.0000 mg | Freq: Once | INTRAMUSCULAR | Status: AC
  Administered 2021-10-14: 125 mg via INTRAMUSCULAR

## 2021-10-14 MED ORDER — PREDNISONE 20 MG PO TABS: 20.0000 mg | ORAL_TABLET | Freq: Two times a day (BID) | ORAL | 0 refills | Status: AC

## 2021-10-14 NOTE — Progress Notes (Signed)
Established Patient Office Visit  Subjective:  Patient ID: Taylor Hicks, female    DOB: 1986/03/18  Age: 36 y.o. MRN: [id number]  CC:  Chief Complaint  Patient presents with   Ear Pain    Ear pain in both ears x 1 1-2 weeks after antibiotics.     HPI Taylor Hicks presents for follow-up of ear congestion and discomfort status post treatment with Augmentin for serous otitis media.  She denies any recent fevers, nasal congestion facial pressure, rhinorrhea cough or difficulty breathing.  No history of otitis media as a child.  She experienced 1 episode of dizziness described as a spinning sensation that is since resolved.  She has been using her Flonase daily for at least 10 days.  Past Medical History:  Diagnosis Date   Allergy    Chronic kidney disease 2016   UTI TURNED INTO KIDNEY INFECTION   Family history of adverse reaction to anesthesia    GRANDMOTHER HAD HARD TIME WAKING UP FROM ANESTHESIA   Headache    OCCAS MIGRAINES   History of chicken pox     Past Surgical History:  Procedure Laterality Date   CESAREAN SECTION N/A 05/05/2014   Procedure: CESAREAN SECTION;  Surgeon: Allena Katz, MD;  Location: [facility];  Service: Obstetrics;  Laterality: N/A;   EYE SURGERY  1996   EYE SURGERY     WOUND EXPLORATION N/A 10/25/2017   Procedure: REMOVAL OF INCISIONAL MASS;  Surgeon: Linda Hedges, DO;  Location: [facility];  Service: Gynecology;  Laterality: N/A;    Family History  Problem Relation Age of Onset   Diabetes Father    Hypertension Father    Hyperlipidemia Mother    Diabetes Paternal Grandmother    Diabetes Paternal Grandfather    COPD Maternal Grandmother    Osteoporosis Maternal Grandmother    Cancer Maternal Grandfather        lung    Social History   Socioeconomic History   Marital status: Married    Spouse name: Not on file   Number of children: Not on file   Years of education: Not on file   Highest education level: Not on file  Occupational History    Not on file  Tobacco Use   Smoking status: Never   Smokeless tobacco: Never  Vaping Use   Vaping Use: Never used  Substance and Sexual Activity   Alcohol use: Yes    Alcohol/week: 0.0 standard drinks    Comment: social    Drug use: No   Sexual activity: Yes    Birth control/protection: Inserts  Other Topics Concern   Not on file  Social History Narrative   Had baby Gareth Eagle born 05/05/14   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   Married   Enjoys to read, [place] has [place] that she enjoys, 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 20 capsule 0   cetirizine (ZYRTEC) 10 MG tablet Take 10 mg by mouth daily as needed for allergies.      fluticasone (FLONASE) 50 MCG/ACT nasal spray Place 2 sprays into both nostrils daily. 16 g 1   amoxicillin-clavulanate (AUGMENTIN) 875-125 MG tablet Take 1 tablet by mouth 2 (two) times daily. 14 tablet 0   albuterol (VENTOLIN HFA) 108 (90 Base) MCG/ACT inhaler Inhale 2 puffs into the lungs every 6 (six) hours as needed for wheezing or shortness of breath. (Patient not taking: Reported on 10/14/2021) 8 g 0   valACYclovir (VALTREX) 1000 MG tablet SMARTSIG:1 Tablet(s) By Mouth Every 12 Hours (Patient not taking: Reported on 10/14/2021)     No facility-administered medications prior to visit.     Allergies  Allergen Reactions   Benzoyl Peroxide Itching   Azithromycin Hives    ROS Review of Systems  Constitutional:  Negative for chills, diaphoresis, fatigue, fever and unexpected weight change.  HENT:  Positive for ear pain, hearing loss and sore throat. Negative for congestion, postnasal drip, rhinorrhea, sinus pressure and sinus pain.   Eyes:  Negative for photophobia and visual disturbance.  Respiratory:  Negative for cough and wheezing.   Gastrointestinal: Negative.   Musculoskeletal:  Negative for arthralgias and myalgias.  Neurological:  Positive for dizziness. Negative for weakness and light-headedness.  Psychiatric/Behavioral: Negative.       Objective:    Physical Exam Constitutional:      General: She is not in acute distress.    Appearance: Normal appearance. She is not ill-appearing, toxic-appearing or diaphoretic.  HENT:     Head: Normocephalic and atraumatic.     Right Ear: External ear normal. No middle ear effusion. Tympanic membrane is retracted. Tympanic membrane is not injected or erythematous.     Left Ear: External ear normal.  No middle ear effusion. Tympanic membrane is retracted. Tympanic membrane is not injected or erythematous.     Mouth/Throat:     Mouth: Mucous membranes are moist.     Pharynx: Oropharynx is clear. No oropharyngeal exudate or posterior oropharyngeal erythema.  Eyes:     General: No scleral icterus.       Right eye: No discharge.        Left eye: No discharge.     Extraocular Movements: Extraocular movements intact.     Conjunctiva/sclera: Conjunctivae normal.     Pupils: Pupils are equal, round, and reactive to light.  Neck:     Vascular: No carotid bruit.  Cardiovascular:     Rate and Rhythm: Normal rate and regular rhythm.  Pulmonary:     Effort: Pulmonary effort is normal. No respiratory distress.     Breath sounds: Normal breath sounds. No wheezing or rales.  Musculoskeletal:     Cervical back: No rigidity or  tenderness.  Lymphadenopathy:     Cervical: No cervical adenopathy.  Skin:    General: Skin is warm and dry.  Neurological:     Mental Status: She is alert and oriented to person, place, and time.  Psychiatric:        Mood and Affect: Mood normal.        Behavior: Behavior normal.    BP 122/70 (BP Location: Right Arm, Patient Position: Sitting, Cuff Size: Normal)    Pulse 76    Temp 98 F (36.7 C) (Temporal)    Ht 5\' 6"  (1.676 m)  Wt 235 lb 12.8 oz (107 kg)    SpO2 97%    BMI 38.06 kg/m  Wt Readings from Last 3 Encounters:  10/14/21 235 lb 12.8 oz (107 kg)  10/07/21 233 lb (105.7 kg)  05/27/21 232 lb 9.6 oz (105.5 kg)     Health Maintenance Due  Topic Date Due   Hepatitis C Screening  Never done    There are no preventive care reminders to display for this patient.  Lab Results  Component Value Date   TSH 0.75 10/07/2021   Lab Results  Component Value Date   WBC 5.0 10/07/2021   HGB 12.8 10/07/2021   HCT 39.6 10/07/2021   MCV 84.0 10/07/2021   PLT 143.0 (L) 10/07/2021   Lab Results  Component Value Date   NA 137 10/07/2021   K 4.5 10/07/2021   CO2 32 10/07/2021   GLUCOSE 86 10/07/2021   BUN 13 10/07/2021   CREATININE 0.80 10/07/2021   BILITOT 1.0 10/07/2021   ALKPHOS 70 10/07/2021   AST 16 10/07/2021   ALT 16 10/07/2021   PROT 7.1 10/07/2021   ALBUMIN 4.2 10/07/2021   CALCIUM 9.0 10/07/2021   GFR 94.79 10/07/2021   Lab Results  Component Value Date   CHOL 152 10/07/2021   Lab Results  Component Value Date   HDL 42.60 10/07/2021   Lab Results  Component Value Date   LDLCALC 87 10/07/2021   Lab Results  Component Value Date   TRIG 110.0 10/07/2021   Lab Results  Component Value Date   CHOLHDL 4 10/07/2021   Lab Results  Component Value Date   HGBA1C 5.3 10/07/2021      Assessment & Plan:   Problem List Items Addressed This Visit       Respiratory   Viral upper respiratory tract infection     Nervous and Auditory    Dysfunction of both eustachian tubes - Primary   Relevant Medications   predniSONE (DELTASONE) 20 MG tablet    Meds ordered this encounter  Medications   methylPREDNISolone sodium succinate (SOLU-MEDROL) 125 mg/2 mL injection 125 mg   predniSONE (DELTASONE) 20 MG tablet    Sig: Take 1 tablet (20 mg total) by mouth 2 (two) times daily with a meal for 7 days.    Dispense:  14 tablet    Refill:  0    Follow-up: Return if symptoms worsen or fail to improve.  Solu-Medrol injection today.  We will start prednisone 20 twice daily tomorrow.  Continue with Flonase.  Libby Maw, MD

## 2023-01-04 ENCOUNTER — Ambulatory Visit (INDEPENDENT_AMBULATORY_CARE_PROVIDER_SITE_OTHER): Payer: 59 | Admitting: Nurse Practitioner

## 2023-01-04 ENCOUNTER — Encounter: Payer: Self-pay | Admitting: Nurse Practitioner

## 2023-01-04 VITALS — BP 102/70 | HR 67 | Temp 98.4°F | Resp 16 | Ht 66.0 in | Wt 235.4 lb

## 2023-01-04 DIAGNOSIS — Z0001 Encounter for general adult medical examination with abnormal findings: Secondary | ICD-10-CM | POA: Diagnosis not present

## 2023-01-04 DIAGNOSIS — E559 Vitamin D deficiency, unspecified: Secondary | ICD-10-CM

## 2023-01-04 DIAGNOSIS — Z8619 Personal history of other infectious and parasitic diseases: Secondary | ICD-10-CM

## 2023-01-04 DIAGNOSIS — N760 Acute vaginitis: Secondary | ICD-10-CM | POA: Diagnosis not present

## 2023-01-04 DIAGNOSIS — Z6837 Body mass index (BMI) 37.0-37.9, adult: Secondary | ICD-10-CM | POA: Diagnosis not present

## 2023-01-04 DIAGNOSIS — Z13220 Encounter for screening for lipoid disorders: Secondary | ICD-10-CM

## 2023-01-04 MED ORDER — FLUCONAZOLE 150 MG PO TABS: 150.0000 mg | ORAL_TABLET | Freq: Every day | ORAL | 0 refills | Status: AC

## 2023-01-04 MED ORDER — VALACYCLOVIR HCL 1 G PO TABS: ORAL_TABLET | ORAL | 0 refills | Status: AC

## 2023-01-04 NOTE — Assessment & Plan Note (Signed)
Advised about importance of heart healthy diet and daily exercise

## 2023-01-04 NOTE — Patient Instructions (Signed)
Schedule fasting lab appt. Need to be fasting 8hrs prior to blood draw. Ok to drink water and take BP meds. Continue Heart healthy diet and daily exercise. Try melatonin or magnesium glycinate or unisom for sleep  Preventive Care 26-38 Years Old, Female Preventive care refers to lifestyle choices and visits with your health care provider that can promote health and wellness. Preventive care visits are also called wellness exams. What can I expect for my preventive care visit? Counseling During your preventive care visit, your health care provider may ask about your: Medical history, including: Past medical problems. Family medical history. Pregnancy history. Current health, including: Menstrual cycle. Method of birth control. Emotional well-being. Home life and relationship well-being. Sexual activity and sexual health. Lifestyle, including: Alcohol, nicotine or tobacco, and drug use. Access to firearms. Diet, exercise, and sleep habits. Work and work Astronomer. Sunscreen use. Safety issues such as seatbelt and bike helmet use. Physical exam Your health care provider may check your: Height and weight. These may be used to calculate your BMI (body mass index). BMI is a measurement that tells if you are at a healthy weight. Waist circumference. This measures the distance around your waistline. This measurement also tells if you are at a healthy weight and may help predict your risk of certain diseases, such as type 2 diabetes and high blood pressure. Heart rate and blood pressure. Body temperature. Skin for abnormal spots. What immunizations do I need?  Vaccines are usually given at various ages, according to a schedule. Your health care provider will recommend vaccines for you based on your age, medical history, and lifestyle or other factors, such as travel or where you work. What tests do I need? Screening Your health care provider may recommend screening tests for certain  conditions. This may include: Pelvic exam and Pap test. Lipid and cholesterol levels. Diabetes screening. This is done by checking your blood sugar (glucose) after you have not eaten for a while (fasting). Hepatitis B test. Hepatitis C test. HIV (human immunodeficiency virus) test. STI (sexually transmitted infection) testing, if you are at risk. BRCA-related cancer screening. This may be done if you have a family history of breast, ovarian, tubal, or peritoneal cancers. Talk with your health care provider about your test results, treatment options, and if necessary, the need for more tests. Follow these instructions at home: Eating and drinking  Eat a healthy diet that includes fresh fruits and vegetables, whole grains, lean protein, and low-fat dairy products. Take vitamin and mineral supplements as recommended by your health care provider. Do not drink alcohol if: Your health care provider tells you not to drink. You are pregnant, may be pregnant, or are planning to become pregnant. If you drink alcohol: Limit how much you have to 0-1 drink a day. Know how much alcohol is in your drink. In the [place] one drink equals one 12 oz bottle of beer (355 mL), one 5 oz glass of wine (148 mL), or one 1 oz glass of hard liquor (44 mL). Lifestyle Brush your teeth every morning and night with fluoride toothpaste. Floss one time each day. Exercise for at least 30 minutes 5 or more days each week. Do not use any products that contain nicotine or tobacco. These products include cigarettes, chewing tobacco, and vaping devices, such as e-cigarettes. If you need help quitting, ask your health care provider. Do not use drugs. If you are sexually active, practice safe sex. Use a condom or other form of protection to prevent STIs.  If you do not wish to become pregnant, use a form of birth control. If you plan to become pregnant, see your health care provider for a prepregnancy visit. Find healthy ways to  manage stress, such as: Meditation, yoga, or listening to music. Journaling. Talking to a trusted person. Spending time with friends and family. Minimize exposure to UV radiation to reduce your risk of skin cancer. Safety Always wear your seat belt while driving or riding in a vehicle. Do not drive: If you have been drinking alcohol. Do not ride with someone who has been drinking. If you have been using any mind-altering substances or drugs. While texting. When you are tired or distracted. Wear a helmet and other protective equipment during sports activities. If you have firearms in your house, make sure you follow all gun safety procedures. Seek help if you have been physically or sexually abused. What's next? Go to your health care provider once a year for an annual wellness visit. Ask your health care provider how often you should have your eyes and teeth checked. Stay up to date on all vaccines. This information is not intended to replace advice given to you by your health care provider. Make sure you discuss any questions you have with your health care provider. Document Revised: 02/03/2021 Document Reviewed: 02/03/2021 Elsevier Patient Education  2023 [employer].

## 2023-01-04 NOTE — Assessment & Plan Note (Signed)
Outbreak infrequent. Previous valtrex rx expired Refill sent

## 2023-01-04 NOTE — Progress Notes (Signed)
Complete physical exam  Patient: Taylor Hicks   DOB: [birth year]   37 y.o. Female  MRN: [id number] Visit Date: 01/04/2023  Subjective:    Chief Complaint  Patient presents with   Annual Exam    Fasting-  No    ARANTZA Hicks is a 37 y.o. female who presents today for a complete physical exam. She reports consuming a general diet.  No consistent exercise regimen.  She generally feels well. She reports sleeping well. She does not have additional problems to discuss today.  Vision:Yes Dental:Yes STD Screen:No  BP Readings from Last 3 Encounters:  01/04/23 102/70  10/14/21 122/70  10/07/21 118/72   Wt Readings from Last 3 Encounters:  01/04/23 235 lb 6.4 oz (106.8 kg)  10/14/21 235 lb 12.8 oz (107 kg)  10/07/21 233 lb (105.7 kg)   Most recent fall risk assessment:    01/04/2023    9:40 AM  Fall Risk   Falls in the past year? 0  Number falls in past yr: 0  Injury with Fall? 0  Risk for fall due to : No Fall Risks  Follow up Falls evaluation completed   Depression screen:Yes - No Depression Most recent depression screenings:    01/04/2023    9:40 AM 10/14/2021    4:14 PM  PHQ 2/9 Scores  PHQ - 2 Score 0 0   HPI  Recurrent vaginitis Recurrent vaginal itching and discharge: no improvement with OTC antifungal cream Not sexually active Trigger: swimming and wearing wet bathing suit. Needs refill on diflucan.  History of herpes labialis Outbreak infrequent. Previous valtrex rx expired Refill sent  Class 2 severe obesity due to excess calories with serious comorbidity and body mass index (BMI) of 37.0 to 37.9 in adult [facility]) Advised about importance of heart healthy diet and daily exercise  Vitamin D deficiency Advised about need for OTC vit. D dose 1000IU daily Repeat vit. D today  Past Medical History:  Diagnosis Date   Allergy    Chronic kidney disease 2016   UTI TURNED INTO KIDNEY INFECTION   Family history of adverse reaction to anesthesia    GRANDMOTHER  HAD HARD TIME WAKING UP FROM ANESTHESIA   Headache    OCCAS MIGRAINES   History of chicken pox    Past Surgical History:  Procedure Laterality Date   CESAREAN SECTION N/A 05/05/2014   Procedure: CESAREAN SECTION;  Surgeon: Leslie Andrea, MD;  Location: [facility];  Service: Obstetrics;  Laterality: N/A;   EYE SURGERY  08/22/1994   EYE SURGERY     WOUND EXPLORATION N/A 10/25/2017   Procedure: REMOVAL OF INCISIONAL MASS;  Surgeon: Mitchel Honour, DO;  Location: [facility];  Service: Gynecology;  Laterality: N/A;   Social History   Socioeconomic History   Marital status: Married    Spouse name: Not on file   Number of children: Not on file   Years of education: Not on file   Highest education level: Not on file  Occupational History   Not on file  Tobacco Use   Smoking status: Never   Smokeless tobacco: Never  Vaping Use   Vaping Use: Never used  Substance and Sexual Activity   Alcohol use: Yes    Comment: socially   Drug use: No   Sexual activity: Yes    Birth control/protection: None  Other Topics Concern   Not on file  Social History Narrative   Had baby Colon Branch born 05/05/14   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   Married   Enjoys to read, family has a [place] that she enjoys, Multimedia programmer in Programme researcher, 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 Strain: Not on [employer] Insecurity: Not on file  Transportation Needs: Not on file  Physical Activity: Not on file  Stress: Not on file  Social Connections: Not on file  Intimate Partner Violence: Not on file   Family Status  Relation Name Status   Father Romeo Apple Alive   Mother Cleora Fleet Alive   PGM Norris Cross Deceased   PGF Evalee Mutton Deceased   MGM [place] Nickel Deceased   MGF Kaylyn Lim Deceased   Mat Aunt Wyvonnia Dusky (Not Specified)   Family History  Problem Relation Age of Onset   Diabetes Father    Hypertension  Father    Hyperlipidemia Mother    Diabetes Paternal Grandmother    Heart disease Paternal Grandmother    Diabetes Paternal Grandfather    COPD Maternal Grandmother    Osteoporosis Maternal Grandmother    Cancer Maternal Grandfather        lung   Cancer Maternal Aunt    Allergies  Allergen Reactions   Benzoyl Peroxide Itching   Azithromycin Hives    Patient Care Team: Ranika Mcniel, Bonna Gains, NP as PCP - General (Internal Medicine) Mitchel Honour, DO as Consulting Physician (Obstetrics and Gynecology)   Medications: Outpatient Medications Prior to Visit  Medication Sig   Bacillus Coagulans-Inulin (PROBIOTIC) 1-250 BILLION-MG CAPS Probiotic   fluticasone (FLONASE) 50 MCG/ACT nasal spray Place 2 sprays into both nostrils daily.   [DISCONTINUED] albuterol (VENTOLIN HFA) 108 (90 Base) MCG/ACT inhaler Inhale 2 puffs into the lungs every 6 (six) hours as needed for wheezing or shortness of breath. (Patient not taking: Reported on 10/14/2021)   [DISCONTINUED] azelastine (ASTELIN) 0.1 % nasal spray INSTILL 2 SPRAY IN EACH NOSTRIL EVERY DAY AS DIRECTED   [DISCONTINUED] benzonatate (TESSALON) 100 MG capsule Take 1 capsule (100 mg total) by mouth 2 (two) times daily as needed for cough.   [DISCONTINUED] cetirizine (ZYRTEC) 10 MG tablet Take 10 mg by mouth daily as needed for allergies.    [DISCONTINUED] valACYclovir (VALTREX) 1000 MG tablet SMARTSIG:1 Tablet(s) By Mouth Every 12 Hours (Patient not taking: Reported on 10/14/2021)   No facility-administered medications prior to visit.    Review of Systems  Constitutional:  Negative for activity change, appetite change and unexpected weight change.  Respiratory: Negative.    Cardiovascular: Negative.   Gastrointestinal: Negative.   Endocrine: Negative for cold intolerance and heat intolerance.  Genitourinary: Negative.   Musculoskeletal: Negative.   Skin: Negative.   Neurological: Negative.   Hematological: Negative.   Psychiatric/Behavioral:   Negative for behavioral problems, decreased concentration, dysphoric mood, hallucinations, self-injury, sleep disturbance and suicidal ideas. The patient is not nervous/anxious.         Objective:  BP 102/70 (BP Location: Right Arm, Patient Position: Sitting, Cuff Size: Large)   Pulse 67   Temp 98.4 F (36.9 C) (Temporal)   Resp 16   Ht 5\' 6"  (1.676 m)   Wt 235 lb 6.4 oz (106.8 kg)   LMP 12/31/2022 (Exact Date)   SpO2 99%   BMI 37.99 kg/m     Physical Exam Vitals and nursing note reviewed.  Constitutional:      General: She is not in acute distress. HENT:     Right Ear: Tympanic membrane, ear canal and  external ear normal.     Left Ear: Tympanic membrane, ear canal and external ear normal.     Nose: Nose normal.  Eyes:     Extraocular Movements: Extraocular movements intact.     Conjunctiva/sclera: Conjunctivae normal.     Pupils: Pupils are equal, round, and reactive to light.  Neck:     Thyroid: No thyroid mass, thyromegaly or thyroid tenderness.  Cardiovascular:     Rate and Rhythm: Normal rate and regular rhythm.     Pulses: Normal pulses.     Heart sounds: Normal heart sounds.  Pulmonary:     Effort: Pulmonary effort is normal.     Breath sounds: Normal breath sounds.  Abdominal:     General: Bowel sounds are normal.     Palpations: Abdomen is soft.  Musculoskeletal:        General: Normal range of motion.     Cervical back: Normal range of motion and neck supple.     Right lower leg: No edema.     Left lower leg: No edema.  Lymphadenopathy:     Cervical: No cervical adenopathy.  Skin:    General: Skin is warm and dry.  Neurological:     Mental Status: She is alert and oriented to person, place, and time.     Cranial Nerves: No cranial nerve deficit.  Psychiatric:        Mood and Affect: Mood normal.        Behavior: Behavior normal.        Thought Content: Thought content normal.      No results found for any visits on 01/04/23.    Assessment &  Plan:    Routine Health Maintenance and Physical Exam  Immunization History  Administered Date(s) Administered   Influenza Split 06/07/2012   Influenza,inj,Quad PF,6+ Mos 05/06/2014, 06/05/2015, 06/03/2016, 06/21/2017, 06/11/2018, 07/23/2019   MMR 05/08/2014   Tdap 02/19/2014    Health Maintenance  Topic Date Due   Hepatitis C Screening  Never done   INFLUENZA VACCINE  03/23/2023   PAP SMEAR-Modifier  12/17/2023   DTaP/Tdap/Td (2 - Td or Tdap) 02/20/2024   HIV Screening  Completed   HPV VACCINES  Aged Out   COVID-19 Vaccine  Discontinued    Discussed health benefits of physical activity, and encouraged her to engage in regular exercise appropriate for her age and condition.  Problem List Items Addressed This Visit       Genitourinary   Recurrent vaginitis    Recurrent vaginal itching and discharge: no improvement with OTC antifungal cream Not sexually active Trigger: swimming and wearing wet bathing suit. Needs refill on diflucan.      Relevant Medications   fluconazole (DIFLUCAN) 150 MG tablet     Other   Class 2 severe obesity due to excess calories with serious comorbidity and body mass index (BMI) of 37.0 to 37.9 in adult [facility])    Advised about importance of heart healthy diet and daily exercise      History of herpes labialis    Outbreak infrequent. Previous valtrex rx expired Refill sent      Relevant Medications   valACYclovir (VALTREX) 1000 MG tablet   Vitamin D deficiency    Advised about need for OTC vit. D dose 1000IU daily Repeat vit. D today      Relevant Orders   VITAMIN D 25 Hydroxy (Vit-D Deficiency, Fractures)   Other Visit Diagnoses     Encounter for preventative adult health care exam with  abnormal findings    -  Primary   Relevant Orders   Lipid panel   Comprehensive metabolic panel   Encounter for lipid screening for cardiovascular disease       Relevant Orders   Lipid panel      Return in about 1 year (around 01/04/2024) for  CPE (fasting).     Alysia Penna, NP

## 2023-01-04 NOTE — Assessment & Plan Note (Signed)
Advised about need for OTC vit. D dose 1000IU daily Repeat vit. D today

## 2023-01-04 NOTE — Assessment & Plan Note (Addendum)
Recurrent vaginal itching and discharge: no improvement with OTC antifungal cream Not sexually active Trigger: swimming and wearing wet bathing suit. Needs refill on diflucan.

## 2023-01-06 ENCOUNTER — Other Ambulatory Visit (INDEPENDENT_AMBULATORY_CARE_PROVIDER_SITE_OTHER): Payer: 59

## 2023-01-06 DIAGNOSIS — E559 Vitamin D deficiency, unspecified: Secondary | ICD-10-CM

## 2023-01-06 DIAGNOSIS — Z136 Encounter for screening for cardiovascular disorders: Secondary | ICD-10-CM | POA: Diagnosis not present

## 2023-01-06 DIAGNOSIS — Z0001 Encounter for general adult medical examination with abnormal findings: Secondary | ICD-10-CM

## 2023-01-06 DIAGNOSIS — Z13220 Encounter for screening for lipoid disorders: Secondary | ICD-10-CM | POA: Diagnosis not present

## 2023-01-06 LAB — COMPREHENSIVE METABOLIC PANEL
ALT: 16 U/L (ref 0–35)
AST: 16 U/L (ref 0–37)
Albumin: 4.3 g/dL (ref 3.5–5.2)
Alkaline Phosphatase: 68 U/L (ref 39–117)
BUN: 12 mg/dL (ref 6–23)
CO2: 29 mEq/L (ref 19–32)
Calcium: 9.4 mg/dL (ref 8.4–10.5)
Chloride: 102 mEq/L (ref 96–112)
Creatinine, Ser: 0.81 mg/dL (ref 0.40–1.20)
GFR: 92.57 mL/min (ref >60.00)
Glucose, Bld: 90 mg/dL (ref 70–99)
Potassium: 4.3 mEq/L (ref 3.5–5.1)
Sodium: 138 mEq/L (ref 135–145)
Total Bilirubin: 0.8 mg/dL (ref 0.2–1.2)
Total Protein: 7.3 g/dL (ref 6.0–8.3)

## 2023-01-06 LAB — LIPID PANEL
Cholesterol: 159 mg/dL (ref 0–200)
HDL: 37.6 mg/dL — ABNORMAL LOW (ref >39.00)
LDL Cholesterol: 93 mg/dL (ref 0–99)
NonHDL: 121.64
Total CHOL/HDL Ratio: 4
Triglycerides: 142 mg/dL (ref 0.0–149.0)
VLDL: 28.4 mg/dL (ref 0.0–40.0)

## 2023-01-06 LAB — VITAMIN D 25 HYDROXY (VIT D DEFICIENCY, FRACTURES): VITD: 20.36 ng/mL — ABNORMAL LOW (ref 30.00–100.00)

## 2023-01-08 MED ORDER — VITAMIN D (ERGOCALCIFEROL) 1.25 MG (50000 UNIT) PO CAPS
50000.0000 [IU] | ORAL_CAPSULE | ORAL | 0 refills | Status: DC
Start: 2023-01-08 — End: 2024-01-05

## 2023-01-08 NOTE — Addendum Note (Signed)
Addended by: Michaela Corner on: 01/08/2023 09:12 PM   Modules accepted: Orders

## 2023-03-15 LAB — RESULTS CONSOLE HPV: CHL HPV: NEGATIVE

## 2023-03-15 LAB — HM PAP SMEAR

## 2023-05-22 ENCOUNTER — Encounter: Payer: Self-pay | Admitting: Nurse Practitioner

## 2023-07-11 ENCOUNTER — Telehealth: Payer: Self-pay | Admitting: Nurse Practitioner

## 2023-07-11 NOTE — Telephone Encounter (Signed)
Pt called to speak to a supervisor about a bill she received and about to go to collections. Pt said she had a cpe but was charged extra because they said the pt discussed something else doing that time when she said she didn't. Pt already called and talked to billing and she said called several times to talk to a supervisor at our office but never got in touch with one

## 2023-07-12 NOTE — Telephone Encounter (Signed)
Dawn can you please review?

## 2023-07-13 NOTE — Telephone Encounter (Signed)
Spoke to pt regarding breakdown of bill & EOB from insurance. Pt states that she didn't "discuss" recurrent vaginitis, Herpes, or vitamin D deficiency. She said that [facility] [place] asked if she needed any medication refills and was just being nice. She doesn't feel refilling medications should have incurred a charge. I explained billing is based from documentation of the visit.

## 2023-07-25 NOTE — Telephone Encounter (Signed)
LVM for pt to notify we are not able to refile claim due to billing based on visit documentation. Requested billing dept to remove bad debt since pt made a partial payment.

## 2023-10-08 ENCOUNTER — Telehealth: Payer: 59 | Admitting: Nurse Practitioner

## 2023-10-08 DIAGNOSIS — J301 Allergic rhinitis due to pollen: Secondary | ICD-10-CM

## 2023-10-08 DIAGNOSIS — H6993 Unspecified Eustachian tube disorder, bilateral: Secondary | ICD-10-CM | POA: Diagnosis not present

## 2023-10-08 DIAGNOSIS — B9789 Other viral agents as the cause of diseases classified elsewhere: Secondary | ICD-10-CM | POA: Diagnosis not present

## 2023-10-08 DIAGNOSIS — J019 Acute sinusitis, unspecified: Secondary | ICD-10-CM | POA: Diagnosis not present

## 2023-10-08 MED ORDER — FLUTICASONE PROPIONATE 50 MCG/ACT NA SUSP
2.0000 | Freq: Every day | NASAL | 0 refills | Status: AC
Start: 2023-10-08 — End: ?

## 2023-10-08 NOTE — Progress Notes (Signed)
 I have spent 5 minutes in review of e-visit questionnaire, review and updating patient chart, medical decision making and response to patient.   Claiborne Rigg, NP

## 2023-10-08 NOTE — Progress Notes (Signed)
E-Visit for Sinus Problems  We are sorry that you are not feeling well.  Here is how we plan to help!  Providers prescribe antibiotics to treat infections caused by bacteria. Antibiotics are very powerful in treating bacterial infections when they are used properly. To maintain their effectiveness, they should be used only when necessary. Overuse of antibiotics has resulted in the development of superbugs that are resistant to treatment!    After careful review of your answers, I would not recommend an antibiotic for your condition.  Antibiotics are not effective against viruses and therefore should not be used to treat them. Common examples of infections caused by viruses include colds and flu   Based on what you have shared with me it looks like you have sinusitis.  Sinusitis is inflammation and infection in the sinus cavities of the head.  Based on your presentation I believe you most likely have Acute Viral Sinusitis.This is an infection most likely caused by a virus. There is not specific treatment for viral sinusitis other than to help you with the symptoms until the infection runs its course.  You may use an oral decongestant such as Mucinex D or if you have glaucoma or high blood pressure use plain Mucinex. Saline nasal spray help and can safely be used as often as needed for congestion, I have prescribed: Ipratropium Bromide nasal spray 0.03% 2 sprays in eah nostril 2-3 times a day  Some authorities believe that zinc sprays or the use of Echinacea may shorten the course of your symptoms.  Sinus infections are not as easily transmitted as other respiratory infection, however we still recommend that you avoid close contact with loved ones, especially the very young and elderly.  Remember to wash your hands thoroughly throughout the day as this is the number one way to prevent the spread of infection!  Home Care: Only take medications as instructed by your medical team. Do not take these  medications with alcohol. A steam or ultrasonic humidifier can help congestion.  You can place a towel over your head and breathe in the steam from hot water coming from a faucet. Avoid close contacts especially the very young and the elderly. Cover your mouth when you cough or sneeze. Always remember to wash your hands.  Get Help Right Away If: You develop worsening fever or sinus pain. You develop a severe head ache or visual changes. Your symptoms persist after you have completed your treatment plan.  Make sure you Understand these instructions. Will watch your condition. Will get help right away if you are not doing well or get worse.   Thank you for choosing an e-visit.  Your e-visit answers were reviewed by a board certified advanced clinical practitioner to complete your personal care plan. Depending upon the condition, your plan could have included both over the counter or prescription medications.  Please review your pharmacy choice. Make sure the pharmacy is open so you can pick up prescription now. If there is a problem, you may contact your provider through [employer] and have the prescription routed to another pharmacy.  Your safety is important to [place]. If you have drug allergies check your prescription carefully.   For the next 24 hours you can use MyChart to ask questions about today's visit, request a non-urgent call back, or ask for a work or school excuse. You will get an email in the next two days asking about your experience. I hope that your e-visit has been valuable and will  speed your recovery.

## 2023-10-16 ENCOUNTER — Telehealth: Payer: 59 | Admitting: Nurse Practitioner

## 2023-10-16 VITALS — Ht 66.0 in | Wt 225.0 lb

## 2023-10-16 DIAGNOSIS — J3489 Other specified disorders of nose and nasal sinuses: Secondary | ICD-10-CM

## 2023-10-16 MED ORDER — PREDNISONE 10 MG PO TABS: ORAL_TABLET | ORAL | 0 refills | Status: DC

## 2023-10-16 NOTE — Patient Instructions (Signed)
 It was great to see you!  Finish the doxcycyline as prescribed  Start prednisone, 6 tablets today, then 5 tablets tomorrow, 4 the next day, keep decreasing by 1 every day until gone. You can take these all at once in the morning with food  Continue flonase, netti pot, and start zyrtec/claritin  Let's follow-up if symptoms worsen or don't improve.   Take care,  Rodman Pickle, NP

## 2023-10-16 NOTE — Progress Notes (Signed)
 [facility] [address] [place] [address] Dept: [phone] Dept Fax: [phone]  Virtual Video Visit  I connected with Taylor Hicks on 10/16/23 at  2:00 PM EST by a video enabled telemedicine application and verified that I am speaking with the correct person using two identifiers.  Location patient: Home Location provider: Clinic Persons participating in the virtual visit: Patient; Rodman Pickle, NP; Malena Peer, CMA  I discussed the limitations of evaluation and management by telemedicine and the availability of in person appointments. The patient expressed understanding and agreed to proceed.  Chief Complaint  Patient presents with   Nasal Congestion    With left ear pressure and left side pain along jaw and cheek bone    SUBJECTIVE:  HPI: Taylor Hicks is a 38 y.o. female who presents with left sinus and ear pressure.   She started getting sick around 10/06/2023 with nasal congestion and sinus pain.  She did a virtual visit and was told to start Flonase which she did.  She states that this did not help, her symptoms got worse and she was having coughing and burning in her lungs.  She went to an urgent care and was diagnosed with bronchitis.  She was given Augmentin and prednisone.  A few days later she still was not feeling better and had a chest x-ray which she states showed pneumonia.  She was also prescribed doxycycline.  She states that she has finished the prednisone and Augmentin, however she is still taking the doxycycline.  She states that her symptoms overall have gotten a lot better, she is not having any more chest pain, shortness of breath, congestion.  She did note that the sinus pressure has returned on the left cheek and to her left ear.  It caused her not to be able to sleep last night.  She tried a Vicks shower tablet, Netti pot, and Flonase and this did not help.  She denies fevers, congestion.   She still has a slight intermittent cough.   Patient Active Problem List   Diagnosis Date Noted   Class 2 severe obesity due to excess calories with serious comorbidity and body mass index (BMI) of 37.0 to 37.9 in adult [facility]) 01/04/2023   Recurrent vaginitis 01/04/2023   History of herpes labialis 01/04/2023   Dysfunction of both eustachian tubes 10/14/2021   Viral upper respiratory tract infection 10/14/2021   Endometriosis 07/23/2019   Vitamin D deficiency 07/22/2019   Allergic rhinitis 06/25/2018    Past Surgical History:  Procedure Laterality Date   CESAREAN SECTION N/A 05/05/2014   Procedure: CESAREAN SECTION;  Surgeon: Leslie Andrea, MD;  Location: [facility];  Service: Obstetrics;  Laterality: N/A;   EYE SURGERY  08/22/1994   EYE SURGERY     WOUND EXPLORATION N/A 10/25/2017   Procedure: REMOVAL OF INCISIONAL MASS;  Surgeon: Mitchel Honour, DO;  Location: [facility];  Service: Gynecology;  Laterality: N/A;    Family History  Problem Relation Age of Onset   Diabetes Father    Hypertension Father    Hyperlipidemia Mother    Diabetes Paternal Grandmother    Heart disease Paternal Grandmother    Diabetes Paternal Grandfather    COPD Maternal Grandmother    Osteoporosis Maternal Grandmother    Cancer Maternal Grandfather        lung   Cancer Maternal Aunt     Social History   Tobacco Use   Smoking status: Never  Smokeless tobacco: Never  Vaping Use   Vaping status: Never Used  Substance Use Topics   Alcohol use: Yes    Comment: socially   Drug use: No     Current Outpatient Medications:    Albuterol Sulfate (VENTOLIN HFA IN), Inhale into the lungs as needed., Disp: , Rfl:    doxycycline (ADOXA) 100 MG tablet, Take 100 mg by mouth 2 (two) times daily., Disp: , Rfl:    fluticasone (FLONASE) 50 MCG/ACT nasal spray, Place 2 sprays into both nostrils daily., Disp: 16 g, Rfl: 0   PROMETHAZINE-DM PO, Take by mouth as needed., Disp: , Rfl:    Vitamin D,  Ergocalciferol, (DRISDOL) 1.25 MG (50000 UNIT) CAPS capsule, Take 1 capsule (50,000 Units total) by mouth every 7 (seven) days., Disp: 12 capsule, Rfl: 0   Bacillus Coagulans-Inulin (PROBIOTIC) 1-250 BILLION-MG CAPS, Probiotic (Patient not taking: Reported on 10/16/2023), Disp: , Rfl:    fluconazole (DIFLUCAN) 150 MG tablet, Take 1 tablet (150 mg total) by mouth daily. Take second tab 3days apart from first tab (Patient not taking: Reported on 10/16/2023), Disp: 2 tablet, Rfl: 0   predniSONE (DELTASONE) 10 MG tablet, Take 6 tablets today, then 5 tablets tomorrow, then decrease by 1 tablet every day until gone, Disp: 21 tablet, Rfl: 0   valACYclovir (VALTREX) 1000 MG tablet, 2tabs once as needed (Patient not taking: Reported on 10/16/2023), Disp: 30 tablet, Rfl: 0  Allergies  Allergen Reactions   Benzoyl Peroxide Itching   Azithromycin Hives    ROS: See pertinent positives and negatives per HPI.  OBSERVATIONS/OBJECTIVE:  VITALS per patient if applicable: Today's Vitals   10/16/23 1358  Weight: 225 lb (102.1 kg)  Height: 5\' 6"  (1.676 m)   Body mass index is 36.32 kg/m.    GENERAL: Alert and oriented. Appears well and in no acute distress.  HEENT: Atraumatic. Conjunctiva clear. No obvious abnormalities on inspection of external nose and ears. Left maxillary sinus pain with palpation  NECK: Normal movements of the head and neck.  LUNGS: On inspection, no signs of respiratory distress. Breathing rate appears normal. No obvious gross SOB, gasping or wheezing, and no conversational dyspnea.  CV: No obvious cyanosis.  MS: Moves all visible extremities without noticeable abnormality.  PSYCH/NEURO: Pleasant and cooperative. No obvious depression or anxiety. Speech and thought processing grossly intact.  ASSESSMENT AND PLAN:  Problem List Items Addressed This Visit   None Visit Diagnoses       Sinus pain    -  Primary   Finish antibiotics as prescribe. Continue flonase, start zyrtec  daily. Will prescribe prednisone taper. Encourage fluids. F/U if not improving.        I discussed the assessment and treatment plan with the patient. The patient was provided an opportunity to ask questions and all were answered. The patient agreed with the plan and demonstrated an understanding of the instructions.   The patient was advised to call back or seek an in-person evaluation if the symptoms worsen or if the condition fails to improve as anticipated.   Gerre Scull, NP

## 2024-01-05 ENCOUNTER — Encounter: Payer: Self-pay | Admitting: Nurse Practitioner

## 2024-01-05 ENCOUNTER — Ambulatory Visit (INDEPENDENT_AMBULATORY_CARE_PROVIDER_SITE_OTHER): Payer: 59 | Admitting: Nurse Practitioner

## 2024-01-05 VITALS — BP 120/82 | HR 73 | Temp 97.1°F | Ht 66.0 in | Wt 242.4 lb

## 2024-01-05 DIAGNOSIS — E559 Vitamin D deficiency, unspecified: Secondary | ICD-10-CM | POA: Diagnosis not present

## 2024-01-05 DIAGNOSIS — Z13220 Encounter for screening for lipoid disorders: Secondary | ICD-10-CM

## 2024-01-05 DIAGNOSIS — Z136 Encounter for screening for cardiovascular disorders: Secondary | ICD-10-CM | POA: Diagnosis not present

## 2024-01-05 DIAGNOSIS — Z0000 Encounter for general adult medical examination without abnormal findings: Secondary | ICD-10-CM

## 2024-01-05 DIAGNOSIS — Z0001 Encounter for general adult medical examination with abnormal findings: Secondary | ICD-10-CM | POA: Diagnosis not present

## 2024-01-05 LAB — CBC
HCT: 40.7 % (ref 36.0–46.0)
Hemoglobin: 13.4 g/dL (ref 12.0–15.0)
MCHC: 32.9 g/dL (ref 30.0–36.0)
MCV: 83.7 fl (ref 78.0–100.0)
Platelets: 171 10*3/uL (ref 150.0–400.0)
RBC: 4.86 Mil/uL (ref 3.87–5.11)
RDW: 13 % (ref 11.5–15.5)
WBC: 5 10*3/uL (ref 4.0–10.5)

## 2024-01-05 LAB — LIPID PANEL
Cholesterol: 187 mg/dL (ref 0–200)
HDL: 48.7 mg/dL (ref >39.00)
LDL Cholesterol: 115 mg/dL — ABNORMAL HIGH (ref 0–99)
NonHDL: 138.06
Total CHOL/HDL Ratio: 4
Triglycerides: 117 mg/dL (ref 0.0–149.0)
VLDL: 23.4 mg/dL (ref 0.0–40.0)

## 2024-01-05 NOTE — Patient Instructions (Signed)
 Go to lab Maintain Heart healthy diet and daily exercise.

## 2024-01-05 NOTE — Progress Notes (Signed)
 Complete physical exam  Patient: Taylor Hicks   DOB: [birth year]   38 y.o. Female  MRN: [id number] Visit Date: 01/05/2024  Subjective:     Chief Complaint  Patient presents with   Annual Exam    No concerns and is fasting for labs    SHERYLANN PRIEUR is a 38 y.o. female who presents today for a complete physical exam. She reports consuming a general diet. No consistent exercise She generally feels well. She reports sleeping well. She does not have additional problems to discuss today.  Vision:Yes Dental:Yes STD Screen:No  BP Readings from Last 3 Encounters:  01/05/24 120/82  01/04/23 102/70  10/14/21 122/70   Wt Readings from Last 3 Encounters:  01/05/24 242 lb 6.4 oz (110 kg)  10/16/23 225 lb (102.1 kg)  01/04/23 235 lb 6.4 oz (106.8 kg)   Most recent fall risk assessment:    01/05/2024    9:05 AM  Fall Risk   Falls in the past year? 0  Number falls in past yr: 0  Injury with Fall? 0   Depression screen:Yes - No Depression Most recent depression screenings:    01/05/2024    9:06 AM 01/04/2023    9:40 AM  PHQ 2/9 Scores  PHQ - 2 Score 0 0  PHQ- 9 Score 0     HPI  No problem-specific Assessment & Plan notes found for this encounter.   Past Medical History:  Diagnosis Date   Allergy    Chronic kidney disease 2016   UTI TURNED INTO KIDNEY INFECTION   Family history of adverse reaction to anesthesia    GRANDMOTHER HAD HARD TIME WAKING UP FROM ANESTHESIA   Headache    OCCAS MIGRAINES   History of chicken pox    Past Surgical History:  Procedure Laterality Date   CESAREAN SECTION N/A 05/05/2014   Procedure: CESAREAN SECTION;  Surgeon: Jeanmarie Millet, MD;  Location: [facility];  Service: Obstetrics;  Laterality: N/A;   EYE SURGERY  08/22/1994   EYE SURGERY     WOUND EXPLORATION N/A 10/25/2017   Procedure: REMOVAL OF INCISIONAL MASS;  Surgeon: Dyanna Glasgow, DO;  Location: [facility];  Service: Gynecology;  Laterality: N/A;   Social History   Socioeconomic  History   Marital status: Married    Spouse name: Not on file   Number of children: Not on file   Years of education: Not on file   Highest education level: Master's degree (e.g., MA, MS, MEng, MEd, MSW, MBA)  Occupational History   Not on file  Tobacco Use   Smoking status: Never   Smokeless tobacco: Never  Vaping Use   Vaping status: Never Used  Substance and Sexual Activity   Alcohol use: Yes    Comment: socially   Drug use: No   Sexual activity: Yes    Birth control/protection: None  Other Topics Concern   Not on file  Social History Narrative   Had baby Arvid Birk born 05/05/14   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   Married   Enjoys to read, [place] has [place] that she enjoys, Multimedia programmer in Adult Education- taught at [employer]: Low Risk  (10/16/2023)   Overall Financial Resource Strain (CARDIA)    Difficulty of Paying Living Expenses: Not hard at all  Food Insecurity: No Food Insecurity (10/16/2023)   Hunger Vital Sign    Worried About Running Out  of Food in the Last Year: Never true    Ran Out of Food in the Last Year: Never true  Transportation Needs: No Transportation Needs (10/16/2023)   PRAPARE - Administrator, Civil Service (Medical): No    Lack of Transportation (Non-Medical): No  Physical Activity: Insufficiently Active (10/16/2023)   Exercise Vital Sign    Days of Exercise per Week: 2 days    Minutes of Exercise per Session: 30 min  Stress: No Stress Concern Present (10/16/2023)   [employer] of Occupational Health - Occupational Stress Questionnaire    Feeling of Stress : Not at all  Social Connections: Socially Integrated (10/16/2023)   Social Connection and Isolation Panel [NHANES]    Frequency of Communication with Friends and Family: More than three times a week    Frequency of Social Gatherings with Friends and Family: Twice a week    Attends Religious Services:  More than 4 times per year    Active Member of [employer] or Organizations: Yes    Attends Engineer, structural: More than 4 times per year    Marital Status: Married  Catering manager Violence: Not on file   Family Status  Relation Name Status   Father Elberta Grebe Alive   Mother Rebecka Can Alive   PGM Jerris More Deceased   PGF Hoyt Macleod Deceased   MGM Isidore Mares Deceased   MGF Grayce Lazar Deceased   Mat Aunt Dicie Foster (Not Specified)  No partnership data on file   Family History  Problem Relation Age of Onset   Diabetes Father    Hypertension Father    Hyperlipidemia Mother    Diabetes Paternal Grandmother    Heart disease Paternal Grandmother    Diabetes Paternal Grandfather    COPD Maternal Grandmother    Osteoporosis Maternal Grandmother    Cancer Maternal Grandfather        lung   Cancer Maternal Aunt    Allergies  Allergen Reactions   Benzoyl Peroxide Itching   Azithromycin  Hives    Patient Care Team: Jadin Creque, Connye Delaine, NP as PCP - General (Internal Medicine) Dyanna Glasgow, DO as Consulting Physician (Obstetrics and Gynecology)   Medications: Outpatient Medications Prior to Visit  Medication Sig   fluticasone  (FLONASE ) 50 MCG/ACT nasal spray Place 2 sprays into both nostrils daily.   Bacillus Coagulans-Inulin (PROBIOTIC) 1-250 BILLION-MG CAPS Probiotic (Patient not taking: Reported on 10/16/2023)   fluconazole  (DIFLUCAN ) 150 MG tablet Take 1 tablet (150 mg total) by mouth daily. Take second tab 3days apart from first tab (Patient not taking: Reported on 10/16/2023)   valACYclovir  (VALTREX ) 1000 MG tablet 2tabs once as needed (Patient not taking: Reported on 10/16/2023)   [DISCONTINUED] Albuterol  Sulfate (VENTOLIN  HFA IN) Inhale into the lungs as needed. (Patient not taking: Reported on 01/05/2024)   [DISCONTINUED] predniSONE  (DELTASONE ) 10 MG tablet Take 6 tablets today, then 5 tablets tomorrow, then decrease by 1 tablet  every day until gone (Patient not taking: Reported on 01/05/2024)   [DISCONTINUED] PROMETHAZINE -DM PO Take by mouth as needed. (Patient not taking: Reported on 01/05/2024)   [DISCONTINUED] Vitamin D , Ergocalciferol , (DRISDOL ) 1.25 MG (50000 UNIT) CAPS capsule Take 1 capsule (50,000 Units total) by mouth every 7 (seven) days. (Patient not taking: Reported on 01/05/2024)   No facility-administered medications prior to visit.    Review of Systems  Constitutional:  Negative for activity change, appetite change and unexpected weight change.  Respiratory: Negative.    Cardiovascular: Negative.  Gastrointestinal: Negative.   Endocrine: Negative for cold intolerance and heat intolerance.  Genitourinary: Negative.   Musculoskeletal: Negative.   Skin: Negative.   Neurological: Negative.   Hematological: Negative.   Psychiatric/Behavioral:  Negative for behavioral problems, decreased concentration, dysphoric mood, hallucinations, self-injury, sleep disturbance and suicidal ideas. The patient is not nervous/anxious.         Objective:  BP 120/82 (BP Location: Left Arm, Patient Position: Sitting, Cuff Size: Normal)   Pulse 73   Temp (!) 97.1 F (36.2 C) (Temporal)   Ht 5\' 6"  (1.676 m)   Wt 242 lb 6.4 oz (110 kg)   LMP 12/28/2023 (Exact Date)   SpO2 98%   BMI 39.12 kg/m     Physical Exam Vitals and nursing note reviewed.  Constitutional:      General: She is not in acute distress. HENT:     Right Ear: Tympanic membrane, ear canal and external ear normal.     Left Ear: Tympanic membrane, ear canal and external ear normal.     Nose: Nose normal.  Eyes:     Extraocular Movements: Extraocular movements intact.     Conjunctiva/sclera: Conjunctivae normal.     Pupils: Pupils are equal, round, and reactive to light.  Neck:     Thyroid : No thyroid  mass, thyromegaly or thyroid  tenderness.  Cardiovascular:     Rate and Rhythm: Normal rate and regular rhythm.     Pulses: Normal pulses.      Heart sounds: Normal heart sounds.  Pulmonary:     Effort: Pulmonary effort is normal.     Breath sounds: Normal breath sounds.  Abdominal:     General: Bowel sounds are normal.     Palpations: Abdomen is soft.  Musculoskeletal:        General: Normal range of motion.     Cervical back: Normal range of motion and neck supple.     Right lower leg: No edema.     Left lower leg: No edema.  Lymphadenopathy:     Cervical: No cervical adenopathy.  Skin:    General: Skin is warm and dry.  Neurological:     Mental Status: She is alert and oriented to person, place, and time.     Cranial Nerves: No cranial nerve deficit.  Psychiatric:        Mood and Affect: Mood normal.        Behavior: Behavior normal.        Thought Content: Thought content normal.      No results found for any visits on 01/05/24.    Assessment & Plan:    Routine Health Maintenance and Physical Exam  Immunization History  Administered Date(s) Administered   Influenza Split 06/07/2012   Influenza,inj,Quad PF,6+ Mos 05/06/2014, 06/05/2015, 06/03/2016, 06/21/2017, 06/11/2018, 07/23/2019   MMR 05/08/2014   Tdap 02/19/2014   Health Maintenance  Topic Date Due   Hepatitis C Screening  Never done   DTaP/Tdap/Td (2 - Td or Tdap) 02/20/2024   INFLUENZA VACCINE  03/22/2024   Cervical Cancer Screening (HPV/Pap Cotest)  03/14/2028   HIV Screening  Completed   HPV VACCINES  Aged Out   Meningococcal B Vaccine  Aged Out   COVID-19 Vaccine  Discontinued   Discussed health benefits of physical activity, and encouraged her to engage in regular exercise appropriate for her age and condition.  Problem List Items Addressed This Visit     Vitamin D  deficiency   Relevant Orders   VITAMIN D  25 Hydroxy (Vit-D  Deficiency, Fractures)   Other Visit Diagnoses       Encounter for preventative adult health care exam with abnormal findings    -  Primary   Relevant Orders   Lipid panel   TSH   CBC     Encounter for lipid  screening for cardiovascular disease       Relevant Orders   Lipid panel      Return in about 1 year (around 01/04/2025) for CPE (fasting).     Kathrene Parents, NP

## 2024-01-09 LAB — TSH: TSH: 0.67 u[IU]/mL (ref 0.35–5.50)

## 2024-01-09 LAB — VITAMIN D 25 HYDROXY (VIT D DEFICIENCY, FRACTURES): VITD: 19.33 ng/mL — ABNORMAL LOW (ref 30.00–100.00)

## 2024-01-11 ENCOUNTER — Ambulatory Visit: Payer: Self-pay | Admitting: Nurse Practitioner

## 2024-01-11 DIAGNOSIS — E559 Vitamin D deficiency, unspecified: Secondary | ICD-10-CM

## 2024-01-11 MED ORDER — VITAMIN D (ERGOCALCIFEROL) 1.25 MG (50000 UNIT) PO CAPS: 50000.0000 [IU] | ORAL_CAPSULE | ORAL | 0 refills | Status: AC

## 2024-04-03 ENCOUNTER — Other Ambulatory Visit: Payer: Self-pay | Admitting: Nurse Practitioner

## 2024-04-03 DIAGNOSIS — E559 Vitamin D deficiency, unspecified: Secondary | ICD-10-CM

## 2024-04-04 NOTE — Telephone Encounter (Addendum)
 Called patient to ask patient if she is taking the vitamin d  weekly like Charlotte prescribed and then to switch to an OTC vitamin d  2000 units daily. She confirmed that she is taking how it was prescribed and that she meant to call the pharmacy when she got the alert about a refill was needed. Informed her that I will refuse the request and she thanked me for calling.
